# Patient Record
Sex: Female | Born: 1990 | Race: White | Hispanic: No | Marital: Married | Smoking: Former smoker
Health system: Southern US, Community
[De-identification: ages and names within clinical notes are randomized; demographics above are authoritative.]

## PROBLEM LIST (undated history)

## (undated) DIAGNOSIS — K219 Gastro-esophageal reflux disease without esophagitis: Secondary | ICD-10-CM

## (undated) DIAGNOSIS — G43909 Migraine, unspecified, not intractable, without status migrainosus: Secondary | ICD-10-CM

## (undated) DIAGNOSIS — Z8711 Personal history of peptic ulcer disease: Secondary | ICD-10-CM

## (undated) DIAGNOSIS — F32A Depression, unspecified: Secondary | ICD-10-CM

## (undated) HISTORY — DX: Migraine, unspecified, not intractable, without status migrainosus: G43.909

## (undated) HISTORY — DX: Depression, unspecified: F32.A

## (undated) HISTORY — DX: Gastro-esophageal reflux disease without esophagitis: K21.9

## (undated) HISTORY — DX: Personal history of peptic ulcer disease: Z87.11

---
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
## 2010-04-21 ENCOUNTER — Emergency Department (HOSPITAL_COMMUNITY)
Admission: EM | Admit: 2010-04-21 | Discharge: 2010-04-22 | Disposition: A | Discharge status: Home / Self Care | Payer: Medicaid Other | Attending: Emergency Medicine | Admitting: Emergency Medicine

## 2010-04-21 DIAGNOSIS — L02219 Cutaneous abscess of trunk, unspecified: Secondary | ICD-10-CM | POA: Insufficient documentation

## 2010-04-21 DIAGNOSIS — N949 Unspecified condition associated with female genital organs and menstrual cycle: Secondary | ICD-10-CM | POA: Insufficient documentation

## 2010-12-28 ENCOUNTER — Inpatient Hospital Stay (INDEPENDENT_AMBULATORY_CARE_PROVIDER_SITE_OTHER)
Admission: RE | Admit: 2010-12-28 | Discharge: 2010-12-28 | Disposition: A | Discharge status: Home / Self Care | Payer: Medicaid Other | Source: Ambulatory Visit | Attending: Family Medicine | Admitting: Family Medicine

## 2010-12-28 DIAGNOSIS — L0390 Cellulitis, unspecified: Secondary | ICD-10-CM

## 2010-12-28 DIAGNOSIS — L0291 Cutaneous abscess, unspecified: Secondary | ICD-10-CM

## 2010-12-28 LAB — POCT PREGNANCY, URINE: Preg Test, Ur: NEGATIVE

## 2010-12-31 ENCOUNTER — Inpatient Hospital Stay (HOSPITAL_COMMUNITY)
Admission: RE | Admit: 2010-12-31 | Discharge: 2010-12-31 | Disposition: A | Discharge status: Home / Self Care | Payer: Medicaid Other | Source: Ambulatory Visit | Attending: Emergency Medicine | Admitting: Emergency Medicine

## 2011-03-16 ENCOUNTER — Emergency Department (HOSPITAL_COMMUNITY)
Admission: EM | Admit: 2011-03-16 | Discharge: 2011-03-16 | Disposition: A | Discharge status: Home / Self Care | Payer: Medicaid Other | Attending: Emergency Medicine | Admitting: Emergency Medicine

## 2011-03-16 ENCOUNTER — Encounter (HOSPITAL_COMMUNITY): Payer: Self-pay

## 2011-03-16 DIAGNOSIS — R1013 Epigastric pain: Secondary | ICD-10-CM | POA: Insufficient documentation

## 2011-03-16 DIAGNOSIS — H6120 Impacted cerumen, unspecified ear: Secondary | ICD-10-CM

## 2011-03-16 DIAGNOSIS — J3489 Other specified disorders of nose and nasal sinuses: Secondary | ICD-10-CM | POA: Insufficient documentation

## 2011-03-16 DIAGNOSIS — H9209 Otalgia, unspecified ear: Secondary | ICD-10-CM | POA: Insufficient documentation

## 2011-03-16 DIAGNOSIS — K2970 Gastritis, unspecified, without bleeding: Secondary | ICD-10-CM | POA: Insufficient documentation

## 2011-03-16 MED ORDER — OMEPRAZOLE 20 MG PO CPDR: 20.0000 mg | DELAYED_RELEASE_CAPSULE | Freq: Every day | ORAL | Status: DC

## 2011-03-16 NOTE — ED Notes (Signed)
Pt presents with 2 week h/o "pop rocks" in L ear, pt denies any drainage to ear, reports nasal congestion x 1 week.  Pt also reports 1 year h/o epigastric pain.  Pt reports pain is intermittent and reports nausea when pain returns.  Pt taking omeprazole with  No relief.

## 2011-03-16 NOTE — ED Provider Notes (Signed)
History     CSN: [id number]  Arrival date & time 03/16/11  1540   First MD Initiated Contact with Patient 03/16/11 1738      Chief Complaint  Patient presents with  . Otalgia    (Consider location/radiation/quality/duration/timing/severity/associated sxs/prior treatment) Patient is a 21 y.o. female presenting with ear pain. The history is provided by the patient.  Otalgia Associated symptoms include abdominal pain. Pertinent negatives include no ear discharge, no sore throat, no vomiting and no cough.   the patient is a 21 year old, female, with no significant past medical history.  She states says that she has a sensation of a popping sound in her left ear.  She has also had nasal congestion.  She denies a sore throat, otalgia, fevers, chills, nausea or vomiting.  She also states that she has epigastric pain.  The pain does not seem to be associated with foods.  It occurs during the day and as well as night.  She had been taking Prilosec in the past.  The only when necessary and not consistently.  She denies using alcohol.  She denies taking nonsteroidal medications.  History reviewed. No pertinent past medical history.  History reviewed. No pertinent past surgical history.  No family history on file.  History  Substance Use Topics  . Smoking status: Current Everyday Smoker -- 0.5 packs/day  . Smokeless tobacco: Not on file  . Alcohol Use: No    OB History    Grav Para Term Preterm Abortions TAB SAB Ect Mult Living                  Review of Systems  Constitutional: Negative for fever and chills.  HENT: Positive for ear pain and congestion. Negative for sore throat and ear discharge.   Respiratory: Negative for cough and shortness of breath.   Gastrointestinal: Positive for abdominal pain. Negative for nausea and vomiting.  All other systems reviewed and are negative.    Allergies  Review of patient's allergies indicates no known allergies.  Home Medications    Current Outpatient Rx  Name Route Sig Dispense Refill  . LEVONORGESTREL-ETHINYL ESTRAD 0.15-30 MG-MCG PO TABS Oral Take 1 tablet by mouth daily.    Marland Kitchen OMEPRAZOLE 20 MG PO CPDR Oral Take 1 capsule (20 mg total) by mouth daily. 30 capsule 6    BP 130/75  Pulse 80  Temp(Src) 98.6 F (37 C) (Oral)  Resp 18  Ht 5\' 3"  (1.6 m)  Wt 175 lb (79.379 kg)  BMI 31.00 kg/m2  SpO2 98%  LMP 02/25/2011  Physical Exam  Constitutional: She is oriented to person, place, and time. She appears well-developed and well-nourished.  HENT:  Head: Normocephalic and atraumatic.       Left tympanic membrane is obscured by cerumen.  Eyes: Pupils are equal, round, and reactive to light.  Neck: Normal range of motion.  Pulmonary/Chest: Effort normal.  Abdominal: Soft. She exhibits no distension and no mass. There is tenderness. There is no rebound and no guarding.       Mild epigastric tenderness without peritoneal signs  Musculoskeletal: Normal range of motion. She exhibits no edema and no tenderness.  Neurological: She is alert and oriented to person, place, and time. No cranial nerve deficit.  Skin: Skin is warm and dry. No rash noted. No erythema.  Psychiatric: She has a normal mood and affect. Her behavior is normal.    ED Course  Procedures (including critical care time) cerumen impaction, and epigastric tenderness.  Symptoms,  consisting with gastritis.  There are no indications, that she's got pancreatitis, and acute abdomen or cholelithiasis.  No testing is indicated at this time.  Presently, she is asymptomatic and does not want any medications.  Labs Reviewed - No data to display No results found.   1. Gastritis   2. Cerumen impaction       MDM  Gastritis.  No acute abdomen.  No suggestion of cholelithiasis, pancreatitis Cerumen impaction        Nicholes Stairs, MD 03/16/11 [phone]

## 2012-11-01 ENCOUNTER — Encounter (HOSPITAL_COMMUNITY): Payer: Self-pay | Admitting: Adult Health

## 2012-11-01 ENCOUNTER — Emergency Department (HOSPITAL_COMMUNITY)
Admission: EM | Admit: 2012-11-01 | Discharge: 2012-11-01 | Disposition: A | Discharge status: Home / Self Care | Payer: Medicaid Other | Attending: Emergency Medicine | Admitting: Emergency Medicine

## 2012-11-01 DIAGNOSIS — F17200 Nicotine dependence, unspecified, uncomplicated: Secondary | ICD-10-CM | POA: Insufficient documentation

## 2012-11-01 DIAGNOSIS — R21 Rash and other nonspecific skin eruption: Secondary | ICD-10-CM

## 2012-11-01 DIAGNOSIS — Z79899 Other long term (current) drug therapy: Secondary | ICD-10-CM | POA: Insufficient documentation

## 2012-11-01 LAB — COMPREHENSIVE METABOLIC PANEL
ALT: 9 U/L (ref 0–35)
AST: 14 U/L (ref 0–37)
Albumin: 3.2 g/dL — ABNORMAL LOW (ref 3.5–5.2)
Alkaline Phosphatase: 61 U/L (ref 39–117)
BUN: 7 mg/dL (ref 6–23)
CO2: 24 mEq/L (ref 19–32)
Calcium: 8.8 mg/dL (ref 8.4–10.5)
Chloride: 104 mEq/L (ref 96–112)
Creatinine, Ser: 0.59 mg/dL (ref 0.50–1.10)
GFR calc Af Amer: >90 mL/min (ref >90)
GFR calc non Af Amer: >90 mL/min (ref >90)
Glucose, Bld: 90 mg/dL (ref 70–99)
Potassium: 3.5 mEq/L (ref 3.5–5.1)
Sodium: 138 mEq/L (ref 135–145)
Total Bilirubin: 0.2 mg/dL — ABNORMAL LOW (ref 0.3–1.2)
Total Protein: 7.1 g/dL (ref 6.0–8.3)

## 2012-11-01 LAB — CBC WITH DIFFERENTIAL/PLATELET
Basophils Absolute: 0 10*3/uL (ref 0.0–0.1)
Basophils Relative: 0 % (ref 0–1)
Eosinophils Absolute: 0.3 10*3/uL (ref 0.0–0.7)
Eosinophils Relative: 5 % (ref 0–5)
HCT: 36.3 % (ref 36.0–46.0)
Hemoglobin: 12.9 g/dL (ref 12.0–15.0)
Lymphocytes Relative: 42 % (ref 12–46)
Lymphs Abs: 2.8 10*3/uL (ref 0.7–4.0)
MCH: 31.9 pg (ref 26.0–34.0)
MCHC: 35.5 g/dL (ref 30.0–36.0)
MCV: 89.6 fL (ref 78.0–100.0)
Monocytes Absolute: 0.5 10*3/uL (ref 0.1–1.0)
Monocytes Relative: 7 % (ref 3–12)
Neutro Abs: 3 10*3/uL (ref 1.7–7.7)
Neutrophils Relative %: 46 % (ref 43–77)
Platelets: 240 10*3/uL (ref 150–400)
RBC: 4.05 MIL/uL (ref 3.87–5.11)
RDW: 12.2 % (ref 11.5–15.5)
WBC: 6.6 10*3/uL (ref 4.0–10.5)

## 2012-11-01 LAB — APTT: aPTT: 29 seconds (ref 24–37)

## 2012-11-01 LAB — PROTIME-INR
INR: 0.98 (ref 0.00–1.49)
Prothrombin Time: 12.8 seconds (ref 11.6–15.2)

## 2012-11-01 LAB — FIBRINOGEN: Fibrinogen: 495 mg/dL — ABNORMAL HIGH (ref 204–475)

## 2012-11-01 NOTE — ED Provider Notes (Signed)
CSN: [id number]     Arrival date & time 11/01/12  1534 History  This chart was scribed for non-physician practitioner Jaynie Crumble, PA-C working with Flint Melter, MD by Valera Castle, ED scribe. This patient was seen in room TR09C/TR09C and the patient's care was started at 3:51 PM.    Chief Complaint  Patient presents with  . Rash    The history is provided by the patient. No language interpreter was used.   HPI Comments: Rebecca Parks is a 22 y.o. female who presents to the Emergency Department complaining of a rash to her bilateral hands and feet, onset 3 days ago. She reports it first started with blisters on her hands and then became blotchy, with severe pain and also extended to her fingertips and her feet. She denies any presence of the rash in her mouth. She reports pain, especially when she grabs objects, but she reports no itching. She denies any history of a similar rash. She denies going anywhere unusual and using any new products, detergents, or medications. She reports recently using latex gloves, but that the rash began before using them. She denies fever or any other associated symptoms. She reports being sexually active, but only with one partner. She denies any previous medical history. States did have recent contact with a child with hand, foot, mouth disease.  History reviewed. No pertinent past medical history. History reviewed. No pertinent past surgical history. History reviewed. No pertinent family history. History  Substance Use Topics  . Smoking status: Current Every Day Smoker -- 0.50 packs/day  . Smokeless tobacco: Not on file  . Alcohol Use: No   OB History   Grav Para Term Preterm Abortions TAB SAB Ect Mult Living                 Review of Systems  Constitutional: Negative for fever.  Skin: Positive for rash.  All other systems reviewed and are negative.    Allergies  Review of patient's allergies indicates no known allergies.  Home  Medications   Current Outpatient Rx  Name  Route  Sig  Dispense  Refill  . levonorgestrel-ethinyl estradiol (NORDETTE) 0.15-30 MG-MCG tablet   Oral   Take 1 tablet by mouth daily.         Marland Kitchen EXPIRED: omeprazole (PRILOSEC) 20 MG capsule   Oral   Take 1 capsule (20 mg total) by mouth daily.   30 capsule   6    Triage Vitals: BP 135/71  Pulse 87  Temp(Src) 98.4 F (36.9 C) (Oral)  Resp 16  SpO2 98%  Physical Exam  Nursing note and vitals reviewed. Constitutional: She is oriented to person, place, and time. She appears well-developed and well-nourished.  HENT:  Head: Normocephalic and atraumatic.  Eyes: EOM are normal.  Neck: Normal range of motion. Neck supple.  Cardiovascular: Normal rate.   Pulmonary/Chest: Effort normal.  Musculoskeletal: Normal range of motion.  Neurological: She is alert and oriented to person, place, and time.  Skin: Skin is warm and dry.  Fine erythematous, raised, papular rash mainly to the palms and dorsum of the hands, fingers, wrists, forearms, soles of the feet, and dorsal feet. Rash is sensitive to the touch. Blanching on hands. Few spots that are non blanching to the toes  Psychiatric: She has a normal mood and affect. Her behavior is normal.    ED Course  Procedures (including critical care time)  DIAGNOSTIC STUDIES: Oxygen Saturation is 98% on room air, normal by  my interpretation.    COORDINATION OF CARE: 3:56 PM-Discussed treatment plan which includes a syphilis test, blood work, and Catering manager Dr. Effie Shy with pt at bedside and pt agreed to plan.    Date: 11/01/2012  Rate: 72  Rhythm: normal sinus rhythm  QRS Axis: normal  Intervals: normal  ST/T Wave abnormalities: normal  Conduction Disutrbances: none  Narrative Interpretation:   Old EKG Reviewed: No significant changes noted    Labs Review Labs Reviewed  COMPREHENSIVE METABOLIC PANEL - Abnormal; Notable for the following:    Albumin 3.2 (*)    Total Bilirubin 0.2 (*)     All other components within normal limits  FIBRINOGEN - Abnormal; Notable for the following:    Fibrinogen 495 (*)    All other components within normal limits  CBC WITH DIFFERENTIAL  PROTIME-INR  APTT  RPR   Imaging Review No results found.  MDM  No diagnosis found.   Patient with nonspecific rash to the hands and feet. Rash is not itchy. It is painful. He does not look herpetic, does not have any blisters, no pustules. It does not appear to be scabies since is not itchy. RPR and coagulation panel obtained. Hemorrhage and just slightly elevated everything else appears to be within normal range. RPR is pending. Test results with patient. Patient will be discharged home with Tylenol and Motrin for pain. She will followup with a primary care Dr. or rheumatology if it's not improving. Patient structured to try not to use any latex or Betadine to see if that'll help. She is instructed to return if she develops any fever, chills, malaise, worsening rash, any new concerning symptoms.   Filed Vitals:   11/01/12 1546  BP: 135/71  Pulse: 87  Temp: 98.4 F (36.9 C)  TempSrc: Oral  Resp: 16  SpO2: 98%   I personally performed the services described in this documentation, which was scribed in my presence. The recorded information has been reviewed and is accurate.    Lottie Mussel, PA-C 11/01/12 1749

## 2012-11-01 NOTE — ED Provider Notes (Signed)
  Face-to-face evaluation   History: Rash on her hands and feet for 3 days, after starting to use Betadine  Physical exam: Alert, calm, cooperative. No respiratory distress. Skin exam- fingers, and toes bilaterally, with tiny, red, flat, semi-blanching, scattered rash; without associated epidermal change. No associated joint swelling, deformity, drainage or fluctuance.  Assessment- atypical dermatitis without clear cause. It is most likely consistent with an allergy, with atypical response. An unusual embolic phenomenon, or vasculitis could potentially have this picture as well.  Medical screening examination/treatment/procedure(s) were conducted as a shared visit with non-physician practitioner(s) and myself.  I personally evaluated the patient during the encounter  Flint Melter, MD 11/03/12 1408

## 2012-11-01 NOTE — ED Notes (Addendum)
Presents with rash to wrists that began 2-3 days ago and is beginning to go up forearm. Denies fevers.  Reports itching and pain to soles of feet and inbetween toes.

## 2012-11-02 LAB — RPR: RPR Ser Ql: NONREACTIVE

## 2012-11-02 NOTE — ED Provider Notes (Signed)
Medical screening examination/treatment/procedure(s) were performed by non-physician practitioner and as supervising physician I was immediately available for consultation/collaboration.  Flint Melter, MD 11/02/12 [phone]

## 2013-04-24 ENCOUNTER — Emergency Department (HOSPITAL_COMMUNITY)
Admission: EM | Admit: 2013-04-24 | Discharge: 2013-04-25 | Disposition: A | Discharge status: Home / Self Care | Payer: Medicaid Other | Attending: Emergency Medicine | Admitting: Emergency Medicine

## 2013-04-24 ENCOUNTER — Encounter (HOSPITAL_COMMUNITY): Payer: Self-pay | Admitting: Emergency Medicine

## 2013-04-24 DIAGNOSIS — Z792 Long term (current) use of antibiotics: Secondary | ICD-10-CM | POA: Insufficient documentation

## 2013-04-24 DIAGNOSIS — L0390 Cellulitis, unspecified: Secondary | ICD-10-CM

## 2013-04-24 DIAGNOSIS — L0291 Cutaneous abscess, unspecified: Secondary | ICD-10-CM

## 2013-04-24 DIAGNOSIS — Z79899 Other long term (current) drug therapy: Secondary | ICD-10-CM | POA: Insufficient documentation

## 2013-04-24 DIAGNOSIS — F17200 Nicotine dependence, unspecified, uncomplicated: Secondary | ICD-10-CM | POA: Insufficient documentation

## 2013-04-24 DIAGNOSIS — H60399 Other infective otitis externa, unspecified ear: Secondary | ICD-10-CM | POA: Insufficient documentation

## 2013-04-24 NOTE — ED Provider Notes (Signed)
CSN: [id number]     Arrival date & time 04/24/13  2040 History  This chart was scribed for non-physician practitioner, Lowella Dell, PA-C working with Gerhard Munch, MD by Luisa Dago, ED scribe. This patient was seen in room TR04C/TR04C and the patient's care was started at 9:14 PM.     Chief Complaint  Patient presents with  . Abscess    The history is provided by the patient. No language interpreter was used.   HPI Comments: Rebecca Parks is a 23 y.o. female who presents to the Emergency Department complaining of an abscess that started 2 days ago. Pt states that the abscess is located behind her right ear. She is complaining of associated ear pain secondary to the location of the abscess. She denies any headache, nausea/vomiting, hearing loss, visual changes, rash, fever, chills, or abdominal pain.   History reviewed. No pertinent past medical history. History reviewed. No pertinent past surgical history. History reviewed. No pertinent family history. History  Substance Use Topics  . Smoking status: Current Every Day Smoker -- 0.50 packs/day  . Smokeless tobacco: Never Used  . Alcohol Use: No   OB History   Grav Para Term Preterm Abortions TAB SAB Ect Mult Living                 Review of Systems  All other systems reviewed and are negative.      Allergies  Review of patient's allergies indicates no known allergies.  Home Medications   Current Outpatient Rx  Name  Route  Sig  Dispense  Refill  . Ascorbic Acid (EQL VITAMIN C) 500 MG CHEW   Oral   Chew 1,000 mg by mouth daily.         Marland Kitchen aspirin-acetaminophen-caffeine (EXCEDRIN MIGRAINE) 250-250-65 MG per tablet   Oral   Take 2 tablets by mouth every 6 (six) hours as needed for migraine.         Marland Kitchen GINSENG PO   Oral   Take 1 tablet by mouth daily.         Marland Kitchen ibuprofen (ADVIL,MOTRIN) 200 MG tablet   Oral   Take 600 mg by mouth every 6 (six) hours as needed for headache.          . minocycline  (MINOCIN,DYNACIN) 100 MG capsule   Oral   Take 100 mg by mouth 2 (two) times daily.         . Multiple Vitamin (MULTIVITAMIN WITH MINERALS) TABS tablet   Oral   Take 1 tablet by mouth daily.         . norgestimate-ethinyl estradiol (SPRINTEC 28) 0.25-35 MG-MCG tablet   Oral   Take 1 tablet by mouth daily.         Marland Kitchen omeprazole (PRILOSEC) 20 MG capsule   Oral   Take 20 mg by mouth daily.         Marland Kitchen ibuprofen (ADVIL,MOTRIN) 600 MG tablet   Oral   Take 1 tablet (600 mg total) by mouth every 6 (six) hours as needed.   30 tablet   0   . sulfamethoxazole-trimethoprim (SEPTRA DS) 800-160 MG per tablet   Oral   Take 1 tablet by mouth every 12 (twelve) hours.   20 tablet   0    BP 130/75  Pulse 96  Temp(Src) 98 F (36.7 C)  Resp 18  Ht 5\' 2"  (1.575 m)  Wt 186 lb 11.2 oz (84.687 kg)  BMI 34.14 kg/m2  SpO2 97%  LMP  04/03/2013  Physical Exam  Nursing note and vitals reviewed. Constitutional: She is oriented to person, place, and time. She appears well-developed and well-nourished. No distress.  HENT:  Head: Normocephalic and atraumatic. Head is without laceration, without right periorbital erythema and without left periorbital erythema.  Right Ear: Tympanic membrane and ear canal normal. No lacerations. There is swelling and tenderness. No drainage. No mastoid tenderness. No middle ear effusion. No hemotympanum. No decreased hearing is noted.  Left Ear: Tympanic membrane and ear canal normal. No lacerations. No drainage, swelling or tenderness. No mastoid tenderness.  No middle ear effusion. No hemotympanum. No decreased hearing is noted.  Ears:  Nose: Nose normal.  Mouth/Throat: Uvula is midline, oropharynx is clear and moist and mucous membranes are normal. No oral lesions. No trismus in the jaw. No uvula swelling. No oropharyngeal exudate, posterior oropharyngeal edema, posterior oropharyngeal erythema or tonsillar abscesses.  Abscess behind right ear. Fluctuant mass  and erythema behind right ear. No drainage. Erythema and edema of right ear lobe as depicted in diagram above. Findings consistent with abscess and cellulitis.     Eyes: Conjunctivae and EOM are normal. Pupils are equal, round, and reactive to light.  Neck: Normal range of motion, full passive range of motion without pain and phonation normal. Neck supple. No tracheal tenderness present. No rigidity. No tracheal deviation, no edema, no erythema and normal range of motion present.  Cardiovascular: Normal rate, regular rhythm and normal heart sounds.   No murmur heard. Pulmonary/Chest: Effort normal and breath sounds normal. No stridor. No respiratory distress. She has no wheezes. She has no rales.  Musculoskeletal: Normal range of motion.  Lymphadenopathy:       Head (right side): No submental, no submandibular, no tonsillar, no preauricular and no posterior auricular adenopathy present.       Head (left side): No submental, no submandibular, no tonsillar, no preauricular and no posterior auricular adenopathy present.    She has no cervical adenopathy.  Neurological: She is alert and oriented to person, place, and time.  Skin: Skin is warm and dry.  Psychiatric: She has a normal mood and affect. Her behavior is normal.    ED Course  Procedures (including critical care time)  DIAGNOSTIC STUDIES: Oxygen Saturation is 97% on RA, adequate by my interpretation.    COORDINATION OF CARE: 9:25 PM- Pt advised of plan for treatment and pt agrees 12:04 AM- Abscess was successfully drained and irrigated. Pt tolerated procedure well. Will prescribe antibiotics.   Labs Review Labs Reviewed - No data to display Imaging Review No results found.  EKG Interpretation   None     INCISION AND DRAINAGE Performed by: Rudene Anda Consent: Verbal consent obtained. Risks and benefits: risks, benefits and alternatives were discussed Time out performed prior to procedure Type: abscess Body  area: Right posterior ear Anesthesia: local infiltration Incision was made with a scalpel. Local anesthetic: lidocaine 2% w/o epinephrine Anesthetic total: 1 ml Complexity: complex Blunt dissection to break up loculations Drainage: purulent Drainage amount: 3 mL Packing material: N/A Patient tolerance: Patient tolerated the procedure well with no immediate complications.   MDM   Final diagnoses:  Abscess and cellulitis   Return to Emergency Department in 2 days for wound recheck. Take antibiotics as directed. Return to Emergency Department sooner if you develop any hearing loss, difficulty opening jaw, worsening pain, fever/chills, or dizziness.  Meds given in ED:  Medications - No data to display  Discharge Medication List as of 04/25/2013 12:17  AM    START taking these medications   Details  !! ibuprofen (ADVIL,MOTRIN) 600 MG tablet Take 1 tablet (600 mg total) by mouth every 6 (six) hours as needed., Starting 04/25/2013, Until Discontinued, Print    sulfamethoxazole-trimethoprim (SEPTRA DS) 800-160 MG per tablet Take 1 tablet by mouth every 12 (twelve) hours., Starting 04/25/2013, Until Discontinued, Print     !! - Potential duplicate medications found. Please discuss with provider.       I personally performed the services described in this documentation, which was scribed in my presence. The recorded information has been reviewed and is accurate.    Rudene AndaJacob Gray Caylen Kuwahara, PA-C 04/25/13 1610

## 2013-04-24 NOTE — ED Notes (Signed)
Governor SpeckingP. Lackey PA at bedside performing I/D on pt.'s abscess.

## 2013-04-24 NOTE — ED Notes (Signed)
Pt states swelling behind right ear x 2 days.

## 2013-04-25 MED ORDER — SULFAMETHOXAZOLE-TRIMETHOPRIM 800-160 MG PO TABS: 1.0000 | ORAL_TABLET | Freq: Two times a day (BID) | ORAL | Status: DC

## 2013-04-25 MED ORDER — IBUPROFEN 600 MG PO TABS: 600.0000 mg | ORAL_TABLET | Freq: Four times a day (QID) | ORAL | Status: DC | PRN

## 2013-04-25 NOTE — Discharge Instructions (Signed)
Return to Emergency Department in 2 days for wound recheck. Take antibiotics as directed. Return to Emergency Department sooner if you develop any hearing loss, difficulty opening jaw, worsening pain, fever/chills, or dizziness.

## 2013-04-25 NOTE — ED Provider Notes (Signed)
Medical screening examination/treatment/procedure(s) were performed by non-physician practitioner and as supervising physician I was immediately available for consultation/collaboration.  Rebecca Munchobert Damonique Brunelle, MD 04/25/13 [phone]

## 2014-08-07 ENCOUNTER — Encounter (HOSPITAL_COMMUNITY): Payer: Self-pay | Admitting: *Deleted

## 2014-08-07 ENCOUNTER — Emergency Department (HOSPITAL_COMMUNITY): Payer: Medicaid Other

## 2014-08-07 ENCOUNTER — Emergency Department (HOSPITAL_COMMUNITY)
Admission: EM | Admit: 2014-08-07 | Discharge: 2014-08-07 | Disposition: A | Discharge status: Home / Self Care | Payer: Medicaid Other | Attending: Emergency Medicine | Admitting: Emergency Medicine

## 2014-08-07 DIAGNOSIS — Z720 Tobacco use: Secondary | ICD-10-CM | POA: Insufficient documentation

## 2014-08-07 DIAGNOSIS — Z79899 Other long term (current) drug therapy: Secondary | ICD-10-CM | POA: Insufficient documentation

## 2014-08-07 DIAGNOSIS — R059 Cough, unspecified: Secondary | ICD-10-CM

## 2014-08-07 DIAGNOSIS — J189 Pneumonia, unspecified organism: Secondary | ICD-10-CM

## 2014-08-07 DIAGNOSIS — Z793 Long term (current) use of hormonal contraceptives: Secondary | ICD-10-CM | POA: Insufficient documentation

## 2014-08-07 DIAGNOSIS — R05 Cough: Secondary | ICD-10-CM

## 2014-08-07 DIAGNOSIS — R51 Headache: Secondary | ICD-10-CM | POA: Insufficient documentation

## 2014-08-07 DIAGNOSIS — J159 Unspecified bacterial pneumonia: Secondary | ICD-10-CM | POA: Insufficient documentation

## 2014-08-07 MED ORDER — AZITHROMYCIN 250 MG PO TABS: 250.0000 mg | ORAL_TABLET | Freq: Every day | ORAL | Status: AC

## 2014-08-07 MED ORDER — CEFTRIAXONE SODIUM 1 G IJ SOLR
1.0000 g | Freq: Once | INTRAMUSCULAR | Status: AC
  Administered 2014-08-07: 1 g via INTRAMUSCULAR
  Filled 2014-08-07: qty 10

## 2014-08-07 MED ORDER — ACETAMINOPHEN 325 MG PO TABS
650.0000 mg | ORAL_TABLET | Freq: Once | ORAL | Status: AC
  Administered 2014-08-07: 650 mg via ORAL
  Filled 2014-08-07: qty 2

## 2014-08-07 MED ORDER — AZITHROMYCIN 250 MG PO TABS
500.0000 mg | ORAL_TABLET | Freq: Once | ORAL | Status: AC
  Administered 2014-08-07: 500 mg via ORAL
  Filled 2014-08-07: qty 2

## 2014-08-07 MED ORDER — DEXTROMETHORPHAN POLISTIREX ER 30 MG/5ML PO SUER: 15.0000 mg | Freq: Two times a day (BID) | ORAL | Status: DC

## 2014-08-07 MED ORDER — LIDOCAINE HCL 2 % IJ SOLN
INTRAMUSCULAR | Status: AC
  Administered 2014-08-07: 2.1 mL
  Filled 2014-08-07: qty 20

## 2014-08-07 NOTE — ED Notes (Signed)
Pt reports productive cough with yellowish green sputum, fever and congestion since Monday.

## 2014-08-07 NOTE — ED Provider Notes (Signed)
CSN: [id number]     Arrival date & time 08/07/14  2005 History   None   This chart was scribed for NP, Earley Favor, working with Eber Hong, MD by Marica Otter, ED Scribe. This patient was seen in room WTR7/WTR7 and the patient's care was started at 8:26 PM.  Chief Complaint  Patient presents with  . Nasal Congestion  . Fever  . Cough   The history is provided by the patient. No language interpreter was used.   PCP: Default, Provider, MD HPI Comments: Rebecca Parks is a 24 y.o. female, with PMH noted below, who presents to the Emergency Department complaining of nasal congestion with associated intermittent productive cough with green sputum, and headache onset 3 days ago. Pt reports her last menstrual cycle ended one week ago. Pt reports mucinex at home with no relief. Pt denies a Hx of asthma.   History reviewed. No pertinent past medical history. History reviewed. No pertinent past surgical history. No family history on file. History  Substance Use Topics  . Smoking status: Current Every Day Smoker -- 0.50 packs/day    Types: Cigarettes  . Smokeless tobacco: Never Used  . Alcohol Use: No   OB History    No data available     Review of Systems  HENT: Positive for congestion.   Respiratory: Positive for cough. Negative for shortness of breath.   Neurological: Positive for headaches.  All other systems reviewed and are negative.     Allergies  Review of patient's allergies indicates no known allergies.  Home Medications   Prior to Admission medications   Medication Sig Start Date End Date Taking? Authorizing Provider  Ascorbic Acid (EQL VITAMIN C) 500 MG CHEW Chew 1,000 mg by mouth daily.    Historical Provider, MD  aspirin-acetaminophen-caffeine (EXCEDRIN MIGRAINE) [id number] MG per tablet Take 2 tablets by mouth every 6 (six) hours as needed for migraine.    Historical Provider, MD  GINSENG PO Take 1 tablet by mouth daily.    Historical Provider, MD  ibuprofen  (ADVIL,MOTRIN) 200 MG tablet Take 600 mg by mouth every 6 (six) hours as needed for headache.     Historical Provider, MD  ibuprofen (ADVIL,MOTRIN) 600 MG tablet Take 1 tablet (600 mg total) by mouth every 6 (six) hours as needed. 04/25/13   Cristobal Goldmann, PA-C  minocycline (MINOCIN,DYNACIN) 100 MG capsule Take 100 mg by mouth 2 (two) times daily.    Historical Provider, MD  Multiple Vitamin (MULTIVITAMIN WITH MINERALS) TABS tablet Take 1 tablet by mouth daily.    Historical Provider, MD  norgestimate-ethinyl estradiol (SPRINTEC 28) 0.25-35 MG-MCG tablet Take 1 tablet by mouth daily.    Historical Provider, MD  omeprazole (PRILOSEC) 20 MG capsule Take 20 mg by mouth daily.    Historical Provider, MD  sulfamethoxazole-trimethoprim (SEPTRA DS) 800-160 MG per tablet Take 1 tablet by mouth every 12 (twelve) hours. 04/25/13   Cristobal Goldmann, PA-C   Triage Vitals: BP 122/62 mmHg  Pulse 90  Temp(Src) 101.1 F (38.4 C) (Oral)  Resp 18  SpO2 95%  LMP 07/27/2014 Physical Exam  Constitutional: She is oriented to person, place, and time. She appears well-developed and well-nourished. No distress.  HENT:  Head: Normocephalic and atraumatic.  Right Ear: External ear normal.  Left Ear: External ear normal.  Eyes: Conjunctivae and EOM are normal. Pupils are equal, round, and reactive to light.  Neck: Normal range of motion. Neck supple.  Cardiovascular: Normal rate.   Pulmonary/Chest: Effort normal.  No respiratory distress. She has wheezes. She exhibits no tenderness.  Wheezing R middle lobe area  Abdominal: Soft.  Musculoskeletal: Normal range of motion.  Lymphadenopathy:    She has no cervical adenopathy.  Neurological: She is alert and oriented to person, place, and time.  Skin: Skin is warm and dry.  Psychiatric: She has a normal mood and affect. Her behavior is normal.  Nursing note and vitals reviewed.   ED Course  Procedures (including critical care time) DIAGNOSTIC STUDIES: Oxygen  Saturation is 95% on RA, adequate by my interpretation.    COORDINATION OF CARE: 8:28 PM-Discussed treatment plan which includes chest xray with pt at bedside and pt agreed to plan.  Labs Review Labs Reviewed - No data to display  Imaging Review No results found.   EKG Interpretation None     Will give IM Rocephin  And PO Azithromycin and Rx for completion of antibiotic as well as cough suppressant  MDM   Final diagnoses:  None    I personally performed the services described in this documentation, which was scribed in my presence. The recorded information has been reviewed and is accurate.  Earley Favor, NP 08/07/14 2129  Eber Hong, MD 08/09/14 0001

## 2015-01-08 ENCOUNTER — Encounter (HOSPITAL_COMMUNITY): Payer: Self-pay | Admitting: *Deleted

## 2015-01-08 ENCOUNTER — Emergency Department (HOSPITAL_COMMUNITY)
Admission: EM | Admit: 2015-01-08 | Discharge: 2015-01-08 | Disposition: A | Discharge status: Home / Self Care | Payer: BLUE CROSS/BLUE SHIELD | Attending: Emergency Medicine | Admitting: Emergency Medicine

## 2015-01-08 DIAGNOSIS — Z79899 Other long term (current) drug therapy: Secondary | ICD-10-CM | POA: Insufficient documentation

## 2015-01-08 DIAGNOSIS — Z720 Tobacco use: Secondary | ICD-10-CM | POA: Diagnosis not present

## 2015-01-08 DIAGNOSIS — Z79818 Long term (current) use of other agents affecting estrogen receptors and estrogen levels: Secondary | ICD-10-CM | POA: Diagnosis not present

## 2015-01-08 DIAGNOSIS — Z792 Long term (current) use of antibiotics: Secondary | ICD-10-CM | POA: Insufficient documentation

## 2015-01-08 DIAGNOSIS — M5441 Lumbago with sciatica, right side: Secondary | ICD-10-CM | POA: Diagnosis not present

## 2015-01-08 DIAGNOSIS — M545 Low back pain: Secondary | ICD-10-CM | POA: Diagnosis present

## 2015-01-08 MED ORDER — METHOCARBAMOL 750 MG PO TABS: 750.0000 mg | ORAL_TABLET | Freq: Two times a day (BID) | ORAL | Status: DC

## 2015-01-08 MED ORDER — PREDNISONE 20 MG PO TABS
60.0000 mg | ORAL_TABLET | Freq: Once | ORAL | Status: AC
  Administered 2015-01-08: 60 mg via ORAL
  Filled 2015-01-08: qty 3

## 2015-01-08 MED ORDER — METHOCARBAMOL 500 MG PO TABS: 750.0000 mg | ORAL_TABLET | Freq: Once | ORAL | Status: DC

## 2015-01-08 MED ORDER — KETOROLAC TROMETHAMINE 60 MG/2ML IM SOLN
60.0000 mg | Freq: Once | INTRAMUSCULAR | Status: AC
  Administered 2015-01-08: 60 mg via INTRAMUSCULAR
  Filled 2015-01-08: qty 2

## 2015-01-08 MED ORDER — PREDNISONE 20 MG PO TABS: 60.0000 mg | ORAL_TABLET | Freq: Every day | ORAL | Status: DC

## 2015-01-08 NOTE — Discharge Instructions (Signed)
As we discussed, please follow-up with ortho for ongoing management of your symptoms. In the meantime you may continue the steroid and muscle relaxer as prescribed. You may also take 800mg  ibuprofen as needed for pain and swelling. Return to the ER for new or concerning symptoms.  Please obtain all of your results from medical records or have your doctors office obtain the results - share them with your doctor - you should be seen at your doctors office in the next 2 days. Call today to arrange your follow up. Take the medications as prescribed. Please review all of the medicines and only take them if you do not have an allergy to them. Please be aware that if you are taking birth control pills, taking other prescriptions, ESPECIALLY ANTIBIOTICS may make the birth control ineffective - if this is the case, either do not engage in sexual activity or use alternative methods of birth control such as condoms until you have finished the medicine and your family doctor says it is OK to restart them. If you are on a blood thinner such as COUMADIN, be aware that any other medicine that you take may cause the coumadin to either work too much, or not enough - you should have your coumadin level rechecked in next 7 days if this is the case.  ?  It is also a possibility that you have an allergic reaction to any of the medicines that you have been prescribed - Everybody reacts differently to medications and while MOST people have no trouble with most medicines, you may have a reaction such as nausea, vomiting, rash, swelling, shortness of breath. If this is the case, please stop taking the medicine immediately and contact your physician.  ?  You should return to the ER if you develop severe or worsening symptoms.

## 2015-01-08 NOTE — ED Notes (Signed)
Pt stated "I've had back pain for as long as I can remember but for the past 4 months I've had the sharp, shooting pain down into my left leg".  Pt denied having a PCP.

## 2015-01-08 NOTE — ED Provider Notes (Signed)
CSN: [id number]     Arrival date & time 01/08/15  1504 History   First MD Initiated Contact with Patient 01/08/15 1507     No chief complaint on file.   HPI   Ms. Rebecca Parks is an 24 y.o. female with no significant PMH who presents to the ED for evaluation of low back pain. She states the pain started about four months ago and describes it as dull/aching. She states that for the past couple of months she has also noticed associated sharp/shooting pain in her right leg when the pain flares. She states that the pain is not constant and will come in waves but the episodes are not associated with a particular activity or movement. She has been taking tylenol for the back pain with minimal relief. Denies radiation of the pain. Denies weakness. Denies saddle paresthesias, bowel/bladder incontinence, fever, chills, N/V/D. She states she has had problems with back pain before and was told she has spondylolisthesis. L-spine XR from 2009 shows bilateral L5 pars defects with grade I spondylolisthesis L5 forward on S1 measuring 9 mm (increased from 6 mm in 2006). Denies EtOH, tobacco, or other drug use.   No past medical history on file. No past surgical history on file. No family history on file. Social History  Substance Use Topics  . Smoking status: Current Every Day Smoker -- 0.50 packs/day    Types: Cigarettes  . Smokeless tobacco: Never Used  . Alcohol Use: No   OB History    No data available     Review of Systems  All other systems reviewed and are negative.     Allergies  Review of patient's allergies indicates no known allergies.  Home Medications   Prior to Admission medications   Medication Sig Start Date End Date Taking? Authorizing Provider  Ascorbic Acid (EQL VITAMIN C) 500 MG CHEW Chew 1,000 mg by mouth daily.    Historical Provider, MD  aspirin-acetaminophen-caffeine (EXCEDRIN MIGRAINE) [id number] MG per tablet Take 2 tablets by mouth every 6 (six) hours as needed for  migraine.    Historical Provider, MD  dextromethorphan (DELSYM) 30 MG/5ML liquid Take 2.5 mLs (15 mg total) by mouth 2 (two) times daily. 08/07/14   Earley FavorGail Schulz, NP  GINSENG PO Take 1 tablet by mouth daily.    Historical Provider, MD  ibuprofen (ADVIL,MOTRIN) 200 MG tablet Take 600 mg by mouth every 6 (six) hours as needed for headache.     Historical Provider, MD  ibuprofen (ADVIL,MOTRIN) 600 MG tablet Take 1 tablet (600 mg total) by mouth every 6 (six) hours as needed. 04/25/13   Cristobal GoldmannJacob Lackey, PA-C  minocycline (MINOCIN,DYNACIN) 100 MG capsule Take 100 mg by mouth 2 (two) times daily.    Historical Provider, MD  Multiple Vitamin (MULTIVITAMIN WITH MINERALS) TABS tablet Take 1 tablet by mouth daily.    Historical Provider, MD  norgestimate-ethinyl estradiol (SPRINTEC 28) 0.25-35 MG-MCG tablet Take 1 tablet by mouth daily.    Historical Provider, MD  omeprazole (PRILOSEC) 20 MG capsule Take 20 mg by mouth daily.    Historical Provider, MD  sulfamethoxazole-trimethoprim (SEPTRA DS) 800-160 MG per tablet Take 1 tablet by mouth every 12 (twelve) hours. 04/25/13   Cristobal GoldmannJacob Lackey, PA-C   BP 123/63 mmHg  Pulse 76  Temp(Src) 98.1 F (36.7 C) (Oral)  Resp 16  SpO2 95% Physical Exam  Constitutional: She is oriented to person, place, and time. No distress.  HENT:  Head: Atraumatic.  Right Ear: External ear normal.  Left  Ear: External ear normal.  Mouth/Throat: Oropharynx is clear and moist. No oropharyngeal exudate.  Eyes: Conjunctivae and EOM are normal. Pupils are equal, round, and reactive to light.  Neck: Normal range of motion. Neck supple.  Cardiovascular: Normal rate, regular rhythm, normal heart sounds and intact distal pulses.   No murmur heard. Pulmonary/Chest: Effort normal and breath sounds normal. No respiratory distress. She has no wheezes.  Abdominal: Soft. Bowel sounds are normal. She exhibits no distension. There is no tenderness.  Musculoskeletal: Normal range of motion. She exhibits  no edema or tenderness.  Strength and sensation intact in upper and lower extremities  Lymphadenopathy:    She has no cervical adenopathy.  Neurological: She is alert and oriented to person, place, and time. No cranial nerve deficit. Coordination and gait normal.  Skin: Skin is warm and dry.  Psychiatric: She has a normal mood and affect.  Nursing note and vitals reviewed.   ED Course  Procedures (including critical care time) Labs Review Labs Reviewed - No data to display  Imaging Review No results found. I have personally reviewed and evaluated these images and lab results as part of my medical decision-making.   EKG Interpretation None      MDM   Final diagnoses:  Low back pain with right-sided sciatica, unspecified back pain laterality    No red flag symptoms for cauda equina / epidural abscess. Pt's entire spine nontender to palpation and FROM. I do not think emergent imaging is warranted at this time given chronic nature of pt's presentation and no new injury or trauma. Will give prednisone and ketorolac here. Will give rx for robaxin, short course of prednisone, and encouraged pt to use ibuprofen as needed for pain. Will refer to ortho for f/u. Return precautions given.    Carlene Coria, PA-C 01/08/15 1628  Richardean Canal, MD 01/09/15 [phone]

## 2015-03-04 ENCOUNTER — Emergency Department (HOSPITAL_COMMUNITY)
Admission: EM | Admit: 2015-03-04 | Discharge: 2015-03-04 | Disposition: A | Discharge status: Home / Self Care | Payer: BLUE CROSS/BLUE SHIELD | Attending: Emergency Medicine | Admitting: Emergency Medicine

## 2015-03-04 ENCOUNTER — Encounter (HOSPITAL_COMMUNITY): Payer: Self-pay | Admitting: Emergency Medicine

## 2015-03-04 DIAGNOSIS — Z793 Long term (current) use of hormonal contraceptives: Secondary | ICD-10-CM | POA: Insufficient documentation

## 2015-03-04 DIAGNOSIS — Z79899 Other long term (current) drug therapy: Secondary | ICD-10-CM | POA: Diagnosis not present

## 2015-03-04 DIAGNOSIS — R05 Cough: Secondary | ICD-10-CM | POA: Diagnosis present

## 2015-03-04 DIAGNOSIS — Z7952 Long term (current) use of systemic steroids: Secondary | ICD-10-CM | POA: Insufficient documentation

## 2015-03-04 DIAGNOSIS — Z87891 Personal history of nicotine dependence: Secondary | ICD-10-CM | POA: Insufficient documentation

## 2015-03-04 DIAGNOSIS — Z792 Long term (current) use of antibiotics: Secondary | ICD-10-CM | POA: Insufficient documentation

## 2015-03-04 DIAGNOSIS — J069 Acute upper respiratory infection, unspecified: Secondary | ICD-10-CM

## 2015-03-04 MED ORDER — PSEUDOEPHEDRINE-GUAIFENESIN ER 60-600 MG PO TB12: 1.0000 | ORAL_TABLET | Freq: Two times a day (BID) | ORAL | Status: DC

## 2015-03-04 NOTE — Discharge Instructions (Signed)
There does not appear to be an emergent cause her symptoms at this time. Your symptoms are likely due to a virus and will resolve on their own. Please take your medications as prescribed. Follow-up with your doctor as needed. Return to ED for any new or worsening symptoms.  Upper Respiratory Infection, Adult Most upper respiratory infections (URIs) are a viral infection of the air passages leading to the lungs. A URI affects the nose, throat, and upper air passages. The most common type of URI is nasopharyngitis and is typically referred to as "the common cold." URIs run their course and usually go away on their own. Most of the time, a URI does not require medical attention, but sometimes a bacterial infection in the upper airways can follow a viral infection. This is called a secondary infection. Sinus and middle ear infections are common types of secondary upper respiratory infections. Bacterial pneumonia can also complicate a URI. A URI can worsen asthma and chronic obstructive pulmonary disease (COPD). Sometimes, these complications can require emergency medical care and may be life threatening.  CAUSES Almost all URIs are caused by viruses. A virus is a type of germ and can spread from one person to another.  RISKS FACTORS You may be at risk for a URI if:   You smoke.   You have chronic heart or lung disease.  You have a weakened defense (immune) system.   You are very young or very old.   You have nasal allergies or asthma.  You work in crowded or poorly ventilated areas.  You work in health care facilities or schools. SIGNS AND SYMPTOMS  Symptoms typically develop 2-3 days after you come in contact with a cold virus. Most viral URIs last 7-10 days. However, viral URIs from the influenza virus (flu virus) can last 14-18 days and are typically more severe. Symptoms may include:   Runny or stuffy (congested) nose.   Sneezing.   Cough.   Sore throat.   Headache.    Fatigue.   Fever.   Loss of appetite.   Pain in your forehead, behind your eyes, and over your cheekbones (sinus pain).  Muscle aches.  DIAGNOSIS  Your health care provider may diagnose a URI by:  Physical exam.  Tests to check that your symptoms are not due to another condition such as:  Strep throat.  Sinusitis.  Pneumonia.  Asthma. TREATMENT  A URI goes away on its own with time. It cannot be cured with medicines, but medicines may be prescribed or recommended to relieve symptoms. Medicines may help:  Reduce your fever.  Reduce your cough.  Relieve nasal congestion. HOME CARE INSTRUCTIONS   Take medicines only as directed by your health care provider.   Gargle warm saltwater or take cough drops to comfort your throat as directed by your health care provider.  Use a warm mist humidifier or inhale steam from a shower to increase air moisture. This may make it easier to breathe.  Drink enough fluid to keep your urine clear or pale yellow.   Eat soups and other clear broths and maintain good nutrition.   Rest as needed.   Return to work when your temperature has returned to normal or as your health care provider advises. You may need to stay home longer to avoid infecting others. You can also use a face mask and careful hand washing to prevent spread of the virus.  Increase the usage of your inhaler if you have asthma.   Do  not use any tobacco products, including cigarettes, chewing tobacco, or electronic cigarettes. If you need help quitting, ask your health care provider. PREVENTION  The best way to protect yourself from getting a cold is to practice good hygiene.   Avoid oral or hand contact with people with cold symptoms.   Wash your hands often if contact occurs.  There is no clear evidence that vitamin C, vitamin E, echinacea, or exercise reduces the chance of developing a cold. However, it is always recommended to get plenty of rest,  exercise, and practice good nutrition.  SEEK MEDICAL CARE IF:   You are getting worse rather than better.   Your symptoms are not controlled by medicine.   You have chills.  You have worsening shortness of breath.  You have brown or red mucus.  You have yellow or brown nasal discharge.  You have pain in your face, especially when you bend forward.  You have a fever.  You have swollen neck glands.  You have pain while swallowing.  You have white areas in the back of your throat. SEEK IMMEDIATE MEDICAL CARE IF:   You have severe or persistent:  Headache.  Ear pain.  Sinus pain.  Chest pain.  You have chronic lung disease and any of the following:  Wheezing.  Prolonged cough.  Coughing up blood.  A change in your usual mucus.  You have a stiff neck.  You have changes in your:  Vision.  Hearing.  Thinking.  Mood. MAKE SURE YOU:   Understand these instructions.  Will watch your condition.  Will get help right away if you are not doing well or get worse.   This information is not intended to replace advice given to you by your health care provider. Make sure you discuss any questions you have with your health care provider.   Document Released: 08/10/2000 Document Revised: 07/01/2014 Document Reviewed: 05/22/2013 Elsevier Interactive Patient Education [employer].

## 2015-03-04 NOTE — ED Notes (Signed)
Patient was alert, oriented and stable upon discharge. RN went over AVS and patient had no further questions.  

## 2015-03-04 NOTE — ED Notes (Signed)
Since Saturday pt complaining of runny nose, nasal congestion, cough, headache. No relief with OTC medication. Lungs clear in triage

## 2015-03-04 NOTE — ED Provider Notes (Signed)
CSN: [id number]     Arrival date & time 03/04/15  1637 History  By signing my name below, I, Rebecca Parks, attest that this documentation has been prepared under the direction and in the presence of  Rebecca PeekBenjamin Daily Crate, PA-C. Electronically Signed: [name], ED Scribe. 03/04/2015. 5:11 PM.     Chief Complaint  Patient presents with  . Nasal Congestion  . Cough   The history is provided by the patient. No language interpreter was used.    HPI Comments: Rebecca Parks is a 25 y.o. female otherwise healthy who presents to the Emergency Department complaining of moderate nasal congestion onset 4 days ago with associated cough, sneezing, rhinorrhea, sore throat, HA, sinus tenderness. Pt has taken Dayquil and Nyquil with no relief. Pt notes her boyfriend has similar symptoms, but she had symptoms first. No other sick contacts. Pt has had a flu shot this year. She denies fever, nausea, emesis, abdominal pain, additional symptoms.    History reviewed. No pertinent past medical history. History reviewed. No pertinent past surgical history. History reviewed. No pertinent family history. Social History  Substance Use Topics  . Smoking status: Former Smoker -- 0.50 packs/day    Types: Cigarettes    Quit date: 09/07/2014  . Smokeless tobacco: Never Used  . Alcohol Use: No   OB History    No data available     Review of Systems A 10 point review of systems was completed and was negative except for pertinent positives and negatives as mentioned in the history of present illness.   Allergies  Review of patient's allergies indicates no known allergies.  Home Medications   Prior to Admission medications   Medication Sig Start Date End Date Taking? Authorizing Provider  Ascorbic Acid (EQL VITAMIN C) 500 MG CHEW Chew 1,000 mg by mouth daily.    Historical Provider, MD  aspirin-acetaminophen-caffeine (EXCEDRIN MIGRAINE) [id number] MG per tablet Take 2 tablets by mouth every 6 (six) hours as  needed for migraine.    Historical Provider, MD  dextromethorphan (DELSYM) 30 MG/5ML liquid Take 2.5 mLs (15 mg total) by mouth 2 (two) times daily. 08/07/14   Earley FavorGail Schulz, NP  GINSENG PO Take 1 tablet by mouth daily.    Historical Provider, MD  ibuprofen (ADVIL,MOTRIN) 200 MG tablet Take 600 mg by mouth every 6 (six) hours as needed for headache.     Historical Provider, MD  ibuprofen (ADVIL,MOTRIN) 600 MG tablet Take 1 tablet (600 mg total) by mouth every 6 (six) hours as needed. 04/25/13   Cristobal GoldmannJacob Lackey, PA-C  methocarbamol (ROBAXIN) 750 MG tablet Take 1 tablet (750 mg total) by mouth 2 (two) times daily. 01/08/15   Ace GinsSerena Y Sam, PA-C  minocycline (MINOCIN,DYNACIN) 100 MG capsule Take 100 mg by mouth 2 (two) times daily.    Historical Provider, MD  Multiple Vitamin (MULTIVITAMIN WITH MINERALS) TABS tablet Take 1 tablet by mouth daily.    Historical Provider, MD  norgestimate-ethinyl estradiol (SPRINTEC 28) 0.25-35 MG-MCG tablet Take 1 tablet by mouth daily.    Historical Provider, MD  omeprazole (PRILOSEC) 20 MG capsule Take 20 mg by mouth daily.    Historical Provider, MD  predniSONE (DELTASONE) 20 MG tablet Take 3 tablets (60 mg total) by mouth daily. 01/08/15   Ace GinsSerena Y Sam, PA-C  pseudoephedrine-guaifenesin (MUCINEX D) 60-600 MG 12 hr tablet Take 1 tablet by mouth every 12 (twelve) hours. 03/04/15   Rebecca PeekBenjamin Airrion Otting, PA-C  sulfamethoxazole-trimethoprim (SEPTRA DS) 800-160 MG per tablet Take 1 tablet by  mouth every 12 (twelve) hours. 04/25/13   Cristobal Goldmann, PA-C   BP 139/74 mmHg  Pulse 92  Temp(Src) 98.2 F (36.8 C) (Oral)  Resp 16  SpO2 99% Physical Exam  Constitutional: She is oriented to person, place, and time. She appears well-developed and well-nourished.  Awake, alert, nontoxic appearance.    HENT:  Head: Normocephalic and atraumatic.  Mouth/Throat: Oropharynx is clear and moist. No oropharyngeal exudate.  Eyes: Conjunctivae and EOM are normal. Pupils are equal, round, and reactive  to light.  Neck: Normal range of motion. Neck supple.  Cardiovascular: Normal rate, regular rhythm and normal heart sounds.  Exam reveals no gallop and no friction rub.   No murmur heard. Heart sounds normal. RRR.    Pulmonary/Chest: Effort normal and breath sounds normal. No respiratory distress. She has no wheezes. She has no rales.  Lungs CTA bilaterally.   Abdominal: She exhibits no distension.  Musculoskeletal: Normal range of motion.  Lymphadenopathy:    She has no cervical adenopathy.  Neurological: She is alert and oriented to person, place, and time.  Skin: Skin is warm and dry.  Psychiatric: She has a normal mood and affect. Her behavior is normal.  Nursing note and vitals reviewed.  ED Course  Procedures (including critical care time) DIAGNOSTIC STUDIES: Oxygen Saturation is 99% on RA, normal by my interpretation.    COORDINATION OF CARE: 5:09 PM Discussed treatment plan with pt at bedside which includes symptomatic treatment and pt agreed to plan.    MDM   Final diagnoses:  URI (upper respiratory infection)   Filed Vitals:   03/04/15 1643  BP: 139/74  Pulse: 92  Temp: 98.2 F (36.8 C)  Resp: 16    Meds given in ED:  Medications - No data to display  Discharge Medication List as of 03/04/2015  5:18 PM    START taking these medications   Details  pseudoephedrine-guaifenesin (MUCINEX D) 60-600 MG 12 hr tablet Take 1 tablet by mouth every 12 (twelve) hours., Starting 03/04/2015, Until Discontinued, Print         Pt symptoms consistent with URI. CXR not indicated. Pt will be discharged with symptomatic treatment.  Discussed return precautions.  Pt is hemodynamically stable & in NAD prior to discharge.   I personally performed the services described in this documentation, which was scribed in my presence. The recorded information has been reviewed and is accurate.   Rebecca Peek, PA-C 03/04/15 1821  Lorre Nick, MD 03/05/15 1536

## 2015-07-22 DIAGNOSIS — S39012A Strain of muscle, fascia and tendon of lower back, initial encounter: Secondary | ICD-10-CM | POA: Diagnosis not present

## 2015-07-22 DIAGNOSIS — Y939 Activity, unspecified: Secondary | ICD-10-CM | POA: Insufficient documentation

## 2015-07-22 DIAGNOSIS — Y990 Civilian activity done for income or pay: Secondary | ICD-10-CM | POA: Insufficient documentation

## 2015-07-22 DIAGNOSIS — Z7952 Long term (current) use of systemic steroids: Secondary | ICD-10-CM | POA: Diagnosis not present

## 2015-07-22 DIAGNOSIS — Y929 Unspecified place or not applicable: Secondary | ICD-10-CM | POA: Diagnosis not present

## 2015-07-22 DIAGNOSIS — Z87891 Personal history of nicotine dependence: Secondary | ICD-10-CM | POA: Diagnosis not present

## 2015-07-22 DIAGNOSIS — X500XXA Overexertion from strenuous movement or load, initial encounter: Secondary | ICD-10-CM | POA: Diagnosis not present

## 2015-07-22 DIAGNOSIS — Z79899 Other long term (current) drug therapy: Secondary | ICD-10-CM | POA: Insufficient documentation

## 2015-07-22 DIAGNOSIS — S3992XA Unspecified injury of lower back, initial encounter: Secondary | ICD-10-CM | POA: Diagnosis present

## 2015-07-23 ENCOUNTER — Emergency Department (HOSPITAL_COMMUNITY)
Admission: EM | Admit: 2015-07-23 | Discharge: 2015-07-23 | Disposition: A | Discharge status: Home / Self Care | Payer: Worker's Compensation | Attending: Emergency Medicine | Admitting: Emergency Medicine

## 2015-07-23 ENCOUNTER — Encounter (HOSPITAL_COMMUNITY): Payer: Self-pay | Admitting: Nurse Practitioner

## 2015-07-23 DIAGNOSIS — S39012A Strain of muscle, fascia and tendon of lower back, initial encounter: Secondary | ICD-10-CM

## 2015-07-23 MED ORDER — METHOCARBAMOL 750 MG PO TABS: 750.0000 mg | ORAL_TABLET | Freq: Three times a day (TID) | ORAL | Status: DC

## 2015-07-23 MED ORDER — PREDNISONE 20 MG PO TABS
60.0000 mg | ORAL_TABLET | Freq: Once | ORAL | Status: AC
  Administered 2015-07-23: 60 mg via ORAL
  Filled 2015-07-23: qty 3

## 2015-07-23 MED ORDER — KETOROLAC TROMETHAMINE 30 MG/ML IJ SOLN
30.0000 mg | Freq: Once | INTRAMUSCULAR | Status: AC
  Administered 2015-07-23: 30 mg via INTRAMUSCULAR
  Filled 2015-07-23: qty 1

## 2015-07-23 MED ORDER — METHOCARBAMOL 500 MG PO TABS
750.0000 mg | ORAL_TABLET | Freq: Once | ORAL | Status: AC
  Administered 2015-07-23: 750 mg via ORAL
  Filled 2015-07-23: qty 2

## 2015-07-23 MED ORDER — PREDNISONE 20 MG PO TABS: ORAL_TABLET | ORAL | Status: DC

## 2015-07-23 NOTE — Discharge Instructions (Signed)
You have a muscle strain of your lower back.  You're being treated with an anti-inflammatory medication called prednisone.  Please take this as directed until all tablets have been completed.  Please try to avoid taking Advil or ibuprofen.  We can add Tylenol to your medication regime as needed .  It also been given a muscle relaxer.  Please take this as directed .  He been given a set of exercises that you can start doing in the next 3 days , as well as a referral to a primary care physician   Back Injury Prevention Back injuries can be very painful. They can also be difficult to heal. After having one back injury, you are more likely to injure your back again. It is important to learn how to avoid injuring or re-injuring your back. The following tips can help you to prevent a back injury. WHAT SHOULD I KNOW ABOUT PHYSICAL FITNESS?  Exercise for 30 minutes per day on most days of the week or as told by your doctor. Make sure to:  Do aerobic exercises, such as walking, jogging, biking, or swimming.  Do exercises that increase balance and strength, such as tai chi and yoga.  Do stretching exercises. This helps with flexibility.  Try to develop strong belly (abdominal) muscles. Your belly muscles help to support your back.  Stay at a healthy weight. This helps to decrease your risk of a back injury. WHAT SHOULD I KNOW ABOUT MY DIET?  Talk with your doctor about your overall diet. Take supplements and vitamins only as told by your doctor.  Talk with your doctor about how much calcium and vitamin D you need each day. These nutrients help to prevent weakening of the bones (osteoporosis).  Include good sources of calcium in your diet, such as:  Dairy products.  Green leafy vegetables.  Products that have had calcium added to them (fortified).  Include good sources of vitamin D in your diet, such as:  Milk.  Foods that have had vitamin D added to them. WHAT SHOULD I KNOW ABOUT MY  POSTURE?  Sit up straight and stand up straight. Avoid leaning forward when you sit or hunching over when you stand.  Choose chairs that have good low-back (lumbar) support.  If you work at a desk, sit close to it so you do not need to lean over. Keep your chin tucked in. Keep your neck drawn back. Keep your elbows bent so your arms look like the letter "L" (right angle).  Sit high and close to the steering wheel when you drive. Add a low-back support to your car seat, if needed.  Avoid sitting or standing in one position for very long. Take breaks to get up, stretch, and walk around at least one time every hour. Take breaks every hour if you are driving for long periods of time.  Sleep on your side with your knees slightly bent, or sleep on your back with a pillow under your knees. Do not lie on the front of your body to sleep. WHAT SHOULD I KNOW ABOUT LIFTING, TWISTING, AND REACHING Lifting and Heavy Lifting  Avoid heavy lifting, especially lifting over and over again. If you must do heavy lifting:  Stretch before lifting.  Work slowly.  Rest between lifts.  Use a tool such as a cart or a dolly to move objects if one is available.  Make several small trips instead of carrying one heavy load.  Ask for help when you need it,  especially when moving big objects.  Follow these steps when lifting:  Stand with your feet shoulder-width apart.  Get as close to the object as you can. Do not pick up a heavy object that is far from your body.  Use handles or lifting straps if they are available.  Bend at your knees. Squat down, but keep your heels off the floor.  Keep your shoulders back. Keep your chin tucked in. Keep your back straight.  Lift the object slowly while you tighten the muscles in your legs, belly, and butt. Keep the object as close to the center of your body as possible.  Follow these steps when putting down a heavy load:  Stand with your feet shoulder-width  apart.  Lower the object slowly while you tighten the muscles in your legs, belly, and butt. Keep the object as close to the center of your body as possible.  Keep your shoulders back. Keep your chin tucked in. Keep your back straight.  Bend at your knees. Squat down, but keep your heels off the floor.  Use handles or lifting straps if they are available. Twisting and Reaching  Avoid lifting heavy objects above your waist.  Do not twist at your waist while you are lifting or carrying a load. If you need to turn, move your feet.  Do not bend over without bending at your knees.  Avoid reaching over your head, across a table, or for an object on a high surface.  WHAT ARE SOME OTHER TIPS?  Avoid wet floors and icy ground. Keep sidewalks clear of ice to prevent falls.   Do not sleep on a mattress that is too soft or too hard.   Keep items that you use often within easy reach.   Put heavier objects on shelves at waist level, and put lighter objects on lower or higher shelves.  Find ways to lower your stress, such as:  Exercise.  Massage.  Relaxation techniques.  Talk with your doctor if you feel anxious or depressed. These conditions can make back pain worse.  Wear flat heel shoes with cushioned soles.  Avoid making quick (sudden) movements.  Use both shoulder straps when carrying a backpack.  Do not use any tobacco products, including cigarettes, chewing tobacco, or electronic cigarettes. If you need help quitting, ask your doctor.   This information is not intended to replace advice given to you by your health care provider. Make sure you discuss any questions you have with your health care provider.   Document Released: 08/03/2007 Document Revised: 07/01/2014 Document Reviewed: 02/18/2014 [employer] Interactive Patient Education 2016 [place] Strain With Rehab A strain is an injury in which a tendon or muscle is torn. The muscles and tendons of the  lower back are vulnerable to strains. However, these muscles and tendons are very strong and require a great force to be injured. Strains are classified into three categories. Grade 1 strains cause pain, but the tendon is not lengthened. Grade 2 strains include a lengthened ligament, due to the ligament being stretched or partially ruptured. With grade 2 strains there is still function, although the function may be decreased. Grade 3 strains involve a complete tear of the tendon or muscle, and function is usually impaired. SYMPTOMS   Pain in the lower back.  Pain that affects one side more than the other.  Pain that gets worse with movement and may be felt in the hip, buttocks, or back of the thigh.  Muscle  spasms of the muscles in the back.  Swelling along the muscles of the back.  Loss of strength of the back muscles.  Crackling sound (crepitation) when the muscles are touched. CAUSES  Lower back strains occur when a force is placed on the muscles or tendons that is greater than they can handle. Common causes of injury include:  Prolonged overuse of the muscle-tendon units in the lower back, usually from incorrect posture.  A single violent injury or force applied to the back. RISK INCREASES WITH:  Sports that involve twisting forces on the spine or a lot of bending at the waist (football, rugby, weightlifting, bowling, golf, tennis, speed skating, racquetball, swimming, running, gymnastics, diving).  Poor strength and flexibility.  Failure to warm up properly before activity.  Family history of lower back pain or disk disorders.  Previous back injury or surgery (especially fusion).  Poor posture with lifting, especially heavy objects.  Prolonged sitting, especially with poor posture. PREVENTION   Learn and use proper posture when sitting or lifting (maintain proper posture when sitting, lift using the knees and legs, not at the waist).  Warm up and stretch properly before  activity.  Allow for adequate recovery between workouts.  Maintain physical fitness:  Strength, flexibility, and endurance.  Cardiovascular fitness. PROGNOSIS  If treated properly, lower back strains usually heal within 6 weeks. RELATED COMPLICATIONS   Recurring symptoms, resulting in a chronic problem.  Chronic inflammation, scarring, and partial muscle-tendon tear.  Delayed healing or resolution of symptoms.  Prolonged disability. TREATMENT  Treatment first involves the use of ice and medicine, to reduce pain and inflammation. The use of strengthening and stretching exercises may help reduce pain with activity. These exercises may be performed at home or with a therapist. Severe injuries may require referral to a therapist for further evaluation and treatment, such as ultrasound. Your caregiver may advise that you wear a back brace or corset, to help reduce pain and discomfort. Often, prolonged bed rest results in greater harm then benefit. Corticosteroid injections may be recommended. However, these should be reserved for the most serious cases. It is important to avoid using your back when lifting objects. At night, sleep on your back on a firm mattress with a pillow placed under your knees. If non-surgical treatment is unsuccessful, surgery may be needed.  MEDICATION   If pain medicine is needed, nonsteroidal anti-inflammatory medicines (aspirin and ibuprofen), or other minor pain relievers (acetaminophen), are often advised.  Do not take pain medicine for 7 days before surgery.  Prescription pain relievers may be given, if your caregiver thinks they are needed. Use only as directed and only as much as you need.  Ointments applied to the skin may be helpful.  Corticosteroid injections may be given by your caregiver. These injections should be reserved for the most serious cases, because they may only be given a certain number of times. HEAT AND COLD  Cold treatment (icing)  should be applied for 10 to 15 minutes every 2 to 3 hours for inflammation and pain, and immediately after activity that aggravates your symptoms. Use ice packs or an ice massage.  Heat treatment may be used before performing stretching and strengthening activities prescribed by your caregiver, physical therapist, or athletic trainer. Use a heat pack or a warm water soak. SEEK MEDICAL CARE IF:   Symptoms get worse or do not improve in 2 to 4 weeks, despite treatment.  You develop numbness, weakness, or loss of bowel or bladder function.  New, unexplained symptoms develop. (Drugs used in treatment may produce side effects.) EXERCISES  RANGE OF MOTION (ROM) AND STRETCHING EXERCISES - Low Back Strain Most people with lower back pain will find that their symptoms get worse with excessive bending forward (flexion) or arching at the lower back (extension). The exercises which will help resolve your symptoms will focus on the opposite motion.  Your physician, physical therapist or athletic trainer will help you determine which exercises will be most helpful to resolve your lower back pain. Do not complete any exercises without first consulting with your caregiver. Discontinue any exercises which make your symptoms worse until you speak to your caregiver.  If you have pain, numbness or tingling which travels down into your buttocks, leg or foot, the goal of the therapy is for these symptoms to move closer to your back and eventually resolve. Sometimes, these leg symptoms will get better, but your lower back pain may worsen. This is typically an indication of progress in your rehabilitation. Be very alert to any changes in your symptoms and the activities in which you participated in the 24 hours prior to the change. Sharing this information with your caregiver will allow him/her to most efficiently treat your condition.  These exercises may help you when beginning to rehabilitate your injury. Your symptoms  may resolve with or without further involvement from your physician, physical therapist or athletic trainer. While completing these exercises, remember:  Restoring tissue flexibility helps normal motion to return to the joints. This allows healthier, less painful movement and activity.  An effective stretch should be held for at least 30 seconds.  A stretch should never be painful. You should only feel a gentle lengthening or release in the stretched tissue. FLEXION RANGE OF MOTION AND STRETCHING EXERCISES: STRETCH - Flexion, Single Knee to Chest   Lie on a firm bed or floor with both legs extended in front of you.  Keeping one leg in contact with the floor, bring your opposite knee to your chest. Hold your leg in place by either grabbing behind your thigh or at your knee.  Pull until you feel a gentle stretch in your lower back. Hold __________ seconds.  Slowly release your grasp and repeat the exercise with the opposite side. Repeat __________ times. Complete this exercise __________ times per day.  STRETCH - Flexion, Double Knee to Chest   Lie on a firm bed or floor with both legs extended in front of you.  Keeping one leg in contact with the floor, bring your opposite knee to your chest.  Tense your stomach muscles to support your back and then lift your other knee to your chest. Hold your legs in place by either grabbing behind your thighs or at your knees.  Pull both knees toward your chest until you feel a gentle stretch in your lower back. Hold __________ seconds.  Tense your stomach muscles and slowly return one leg at a time to the floor. Repeat __________ times. Complete this exercise __________ times per day.  STRETCH - Low Trunk Rotation  Lie on a firm bed or floor. Keeping your legs in front of you, bend your knees so they are both pointed toward the ceiling and your feet are flat on the floor.  Extend your arms out to the side. This will stabilize your upper body by  keeping your shoulders in contact with the floor.  Gently and slowly drop both knees together to one side until you feel a gentle stretch in  your lower back. Hold for __________ seconds.  Tense your stomach muscles to support your lower back as you bring your knees back to the starting position. Repeat the exercise to the other side. Repeat __________ times. Complete this exercise __________ times per day  EXTENSION RANGE OF MOTION AND FLEXIBILITY EXERCISES: STRETCH - Extension, Prone on Elbows   Lie on your stomach on the floor, a bed will be too soft. Place your palms about shoulder width apart and at the height of your head.  Place your elbows under your shoulders. If this is too painful, stack pillows under your chest.  Allow your body to relax so that your hips drop lower and make contact more completely with the floor.  Hold this position for __________ seconds.  Slowly return to lying flat on the floor. Repeat __________ times. Complete this exercise __________ times per day.  RANGE OF MOTION - Extension, Prone Press Ups  Lie on your stomach on the floor, a bed will be too soft. Place your palms about shoulder width apart and at the height of your head.  Keeping your back as relaxed as possible, slowly straighten your elbows while keeping your hips on the floor. You may adjust the placement of your hands to maximize your comfort. As you gain motion, your hands will come more underneath your shoulders.  Hold this position __________ seconds.  Slowly return to lying flat on the floor. Repeat __________ times. Complete this exercise __________ times per day.  RANGE OF MOTION- Quadruped, Neutral Spine   Assume a hands and knees position on a firm surface. Keep your hands under your shoulders and your knees under your hips. You may place padding under your knees for comfort.  Drop your head and point your tail bone toward the ground below you. This will round out your lower back  like an angry cat. Hold this position for __________ seconds.  Slowly lift your head and release your tail bone so that your back sags into a large arch, like an old horse.  Hold this position for __________ seconds.  Repeat this until you feel limber in your lower back.  Now, find your "sweet spot." This will be the most comfortable position somewhere between the two previous positions. This is your neutral spine. Once you have found this position, tense your stomach muscles to support your lower back.  Hold this position for __________ seconds. Repeat __________ times. Complete this exercise __________ times per day.  STRENGTHENING EXERCISES - Low Back Strain These exercises may help you when beginning to rehabilitate your injury. These exercises should be done near your "sweet spot." This is the neutral, low-back arch, somewhere between fully rounded and fully arched, that is your least painful position. When performed in this safe range of motion, these exercises can be used for people who have either a flexion or extension based injury. These exercises may resolve your symptoms with or without further involvement from your physician, physical therapist or athletic trainer. While completing these exercises, remember:   Muscles can gain both the endurance and the strength needed for everyday activities through controlled exercises.  Complete these exercises as instructed by your physician, physical therapist or athletic trainer. Increase the resistance and repetitions only as guided.  You may experience muscle soreness or fatigue, but the pain or discomfort you are trying to eliminate should never worsen during these exercises. If this pain does worsen, stop and make certain you are following the directions exactly. If the pain  is still present after adjustments, discontinue the exercise until you can discuss the trouble with your caregiver. STRENGTHENING - Deep Abdominals, Pelvic Tilt  Lie  on a firm bed or floor. Keeping your legs in front of you, bend your knees so they are both pointed toward the ceiling and your feet are flat on the floor.  Tense your lower abdominal muscles to press your lower back into the floor. This motion will rotate your pelvis so that your tail bone is scooping upwards rather than pointing at your feet or into the floor.  With a gentle tension and even breathing, hold this position for __________ seconds. Repeat __________ times. Complete this exercise __________ times per day.  STRENGTHENING - Abdominals, Crunches   Lie on a firm bed or floor. Keeping your legs in front of you, bend your knees so they are both pointed toward the ceiling and your feet are flat on the floor. Cross your arms over your chest.  Slightly tip your chin down without bending your neck.  Tense your abdominals and slowly lift your trunk high enough to just clear your shoulder blades. Lifting higher can put excessive stress on the lower back and does not further strengthen your abdominal muscles.  Control your return to the starting position. Repeat __________ times. Complete this exercise __________ times per day.  STRENGTHENING - Quadruped, Opposite UE/LE Lift   Assume a hands and knees position on a firm surface. Keep your hands under your shoulders and your knees under your hips. You may place padding under your knees for comfort.  Find your neutral spine and gently tense your abdominal muscles so that you can maintain this position. Your shoulders and hips should form a rectangle that is parallel with the floor and is not twisted.  Keeping your trunk steady, lift your right hand no higher than your shoulder and then your left leg no higher than your hip. Make sure you are not holding your breath. Hold this position __________ seconds.  Continuing to keep your abdominal muscles tense and your back steady, slowly return to your starting position. Repeat with the opposite arm  and leg. Repeat __________ times. Complete this exercise __________ times per day.  STRENGTHENING - Lower Abdominals, Double Knee Lift  Lie on a firm bed or floor. Keeping your legs in front of you, bend your knees so they are both pointed toward the ceiling and your feet are flat on the floor.  Tense your abdominal muscles to brace your lower back and slowly lift both of your knees until they come over your hips. Be certain not to hold your breath.  Hold __________ seconds. Using your abdominal muscles, return to the starting position in a slow and controlled manner. Repeat __________ times. Complete this exercise __________ times per day.  POSTURE AND BODY MECHANICS CONSIDERATIONS - Low Back Strain Keeping correct posture when sitting, standing or completing your activities will reduce the stress put on different body tissues, allowing injured tissues a chance to heal and limiting painful experiences. The following are general guidelines for improved posture. Your physician or physical therapist will provide you with any instructions specific to your needs. While reading these guidelines, remember:  The exercises prescribed by your provider will help you have the flexibility and strength to maintain correct postures.  The correct posture provides the best environment for your joints to work. All of your joints have less wear and tear when properly supported by a spine with good posture. This means you  will experience a healthier, less painful body.  Correct posture must be practiced with all of your activities, especially prolonged sitting and standing. Correct posture is as important when doing repetitive low-stress activities (typing) as it is when doing a single heavy-load activity (lifting). RESTING POSITIONS Consider which positions are most painful for you when choosing a resting position. If you have pain with flexion-based activities (sitting, bending, stooping, squatting), choose a  position that allows you to rest in a less flexed posture. You would want to avoid curling into a fetal position on your side. If your pain worsens with extension-based activities (prolonged standing, working overhead), avoid resting in an extended position such as sleeping on your stomach. Most people will find more comfort when they rest with their spine in a more neutral position, neither too rounded nor too arched. Lying on a non-sagging bed on your side with a pillow between your knees, or on your back with a pillow under your knees will often provide some relief. Keep in mind, being in any one position for a prolonged period of time, no matter how correct your posture, can still lead to stiffness. PROPER SITTING POSTURE In order to minimize stress and discomfort on your spine, you must sit with correct posture. Sitting with good posture should be effortless for a healthy body. Returning to good posture is a gradual process. Many people can work toward this most comfortably by using various supports until they have the flexibility and strength to maintain this posture on their own. When sitting with proper posture, your ears will fall over your shoulders and your shoulders will fall over your hips. You should use the back of the chair to support your upper back. Your lower back will be in a neutral position, just slightly arched. You may place a small pillow or folded towel at the base of your lower back for support.  When working at a desk, create an environment that supports good, upright posture. Without extra support, muscles tire, which leads to excessive strain on joints and other tissues. Keep these recommendations in mind: CHAIR:  A chair should be able to slide under your desk when your back makes contact with the back of the chair. This allows you to work closely.  The chair's height should allow your eyes to be level with the upper part of your monitor and your hands to be slightly lower  than your elbows. BODY POSITION  Your feet should make contact with the floor. If this is not possible, use a foot rest.  Keep your ears over your shoulders. This will reduce stress on your neck and lower back. INCORRECT SITTING POSTURES  If you are feeling tired and unable to assume a healthy sitting posture, do not slouch or slump. This puts excessive strain on your back tissues, causing more damage and pain. Healthier options include:  Using more support, like a lumbar pillow.  Switching tasks to something that requires you to be upright or walking.  Talking a brief walk.  Lying down to rest in a neutral-spine position. PROLONGED STANDING WHILE SLIGHTLY LEANING FORWARD  When completing a task that requires you to lean forward while standing in one place for a long time, place either foot up on a stationary 2-4 inch high object to help maintain the best posture. When both feet are on the ground, the lower back tends to lose its slight inward curve. If this curve flattens (or becomes too large), then the back and your other  joints will experience too much stress, tire more quickly, and can cause pain. CORRECT STANDING POSTURES Proper standing posture should be assumed with all daily activities, even if they only take a few moments, like when brushing your teeth. As in sitting, your ears should fall over your shoulders and your shoulders should fall over your hips. You should keep a slight tension in your abdominal muscles to brace your spine. Your tailbone should point down to the ground, not behind your body, resulting in an over-extended swayback posture.  INCORRECT STANDING POSTURES  Common incorrect standing postures include a forward head, locked knees and/or an excessive swayback. WALKING Walk with an upright posture. Your ears, shoulders and hips should all line-up. PROLONGED ACTIVITY IN A FLEXED POSITION When completing a task that requires you to bend forward at your waist or lean  over a low surface, try to find a way to stabilize 3 out of 4 of your limbs. You can place a hand or elbow on your thigh or rest a knee on the surface you are reaching across. This will provide you more stability so that your muscles do not fatigue as quickly. By keeping your knees relaxed, or slightly bent, you will also reduce stress across your lower back. CORRECT LIFTING TECHNIQUES DO :   Assume a wide stance. This will provide you more stability and the opportunity to get as close as possible to the object which you are lifting.  Tense your abdominals to brace your spine. Bend at the knees and hips. Keeping your back locked in a neutral-spine position, lift using your leg muscles. Lift with your legs, keeping your back straight.  Test the weight of unknown objects before attempting to lift them.  Try to keep your elbows locked down at your sides in order get the best strength from your shoulders when carrying an object.  Always ask for help when lifting heavy or awkward objects. INCORRECT LIFTING TECHNIQUES DO NOT:   Lock your knees when lifting, even if it is a small object.  Bend and twist. Pivot at your feet or move your feet when needing to change directions.  Assume that you can safely pick up even a paper clip without proper posture.   This information is not intended to replace advice given to you by your health care provider. Make sure you discuss any questions you have with your health care provider.   Document Released: 02/14/2005 Document Revised: 03/07/2014 Document Reviewed: 05/29/2008 Elsevier Interactive Patient Education [employer].

## 2015-07-23 NOTE — ED Notes (Signed)
Pt states that while at work, she lifted a rack with heavy material when she heard and felt "pop in her lower middle back, then she experienced sharp shooting pain that radiated to her neck and to her legs bilaterally." C/o numbness and tingling in her both feet.

## 2015-07-23 NOTE — ED Provider Notes (Signed)
CSN: [id number]     Arrival date & time 07/22/15  2347 History   First MD Initiated Contact with Patient 07/23/15 0129     Chief Complaint  Patient presents with  . Back Pain     (Consider location/radiation/quality/duration/timing/severity/associated sxs/prior Treatment) HPI Comments: This 25 year old female who states at work.  She was lifting a rack into an autoclave when she felt a pop in her back with pain that radiated up and down her spine into her legs.  She denies any loss of continence.  Inability to walk.  She has not taken any medication prior to arrival for discomfort.  She admits that several years ago with similar type pain with no sequela.  She is to last menstrual cycle was approximately 2 weeks ago, normal.  Patient is a 25 y.o. female presenting with back pain. The history is provided by the patient.  Back Pain Location:  Lumbar spine Quality:  Aching Radiates to:  L thigh and R thigh Pain severity:  Moderate Pain is:  Same all the time Onset quality:  Sudden Timing:  Constant Progression:  Unchanged Chronicity:  Recurrent Context: lifting heavy objects   Relieved by:  Nothing Worsened by:  Movement Ineffective treatments:  None tried Associated symptoms: no abdominal pain, no dysuria, no fever, no numbness and no weakness     History reviewed. No pertinent past medical history. History reviewed. No pertinent past surgical history. History reviewed. No pertinent family history. Social History  Substance Use Topics  . Smoking status: Former Smoker -- 0.50 packs/day    Types: Cigarettes    Quit date: 09/07/2014  . Smokeless tobacco: Never Used  . Alcohol Use: No   OB History    No data available     Review of Systems  Constitutional: Negative for fever and chills.  Respiratory: Negative for shortness of breath.   Gastrointestinal: Negative for nausea, vomiting and abdominal pain.  Genitourinary: Negative for dysuria and frequency.  Musculoskeletal:  Positive for back pain and arthralgias.  Skin: Negative for rash and wound.  Neurological: Negative for weakness and numbness.  All other systems reviewed and are negative.     Allergies  Review of patient's allergies indicates no known allergies.  Home Medications   Prior to Admission medications   Medication Sig Start Date End Date Taking? Authorizing Provider  methocarbamol (ROBAXIN) 750 MG tablet Take 1 tablet (750 mg total) by mouth 3 (three) times daily. 07/23/15   Earley FavorGail Jovan Schickling, NP  predniSONE (DELTASONE) 20 MG tablet 3 Tabs PO Days 1-3, then 2 tabs PO Days 4-6, then 1 tab PO Day 7-9, then Half Tab PO Day 10-12 07/23/15   Earley FavorGail Xzavior Reinig, NP  pseudoephedrine-guaifenesin (MUCINEX D) 60-600 MG 12 hr tablet Take 1 tablet by mouth every 12 (twelve) hours. Patient not taking: Reported on 07/23/2015 03/04/15   Joycie PeekBenjamin Cartner, PA-C   BP 127/80 mmHg  Pulse 82  Temp(Src) 98.1 F (36.7 C) (Oral)  Resp 20  Ht 5\' 1"  (1.549 m)  Wt 83.915 kg  BMI 34.97 kg/m2  SpO2 100%  LMP 07/16/2015 (Approximate) Physical Exam  Constitutional: She appears well-developed and well-nourished.  HENT:  Head: Normocephalic.  Eyes: Pupils are equal, round, and reactive to light.  Neck: Normal range of motion.  Cardiovascular: Normal rate and regular rhythm.   Pulmonary/Chest: Effort normal.  Abdominal: Soft. Bowel sounds are normal.  Musculoskeletal: Normal range of motion. She exhibits tenderness. She exhibits no edema.       Back:  Relation  equal and steady.  Patient has moderate discomfort with bilateral leg raises  Neurological: She is alert.  Skin: Skin is warm.  Nursing note and vitals reviewed.   ED Course  Procedures (including critical care time) Labs Review Labs Reviewed - No data to display  Imaging Review No results found. I have personally reviewed and evaluated these images and lab results as part of my medical decision-making.   EKG Interpretation None      MDM   Final  diagnoses:  Lumbar strain, initial encounter         Earley Favor, NP 07/23/15 7829  Derwood Kaplan, MD 07/23/15 2303

## 2016-09-21 ENCOUNTER — Emergency Department (HOSPITAL_COMMUNITY): Payer: Commercial Managed Care - PPO

## 2016-09-21 ENCOUNTER — Encounter (HOSPITAL_COMMUNITY): Payer: Self-pay | Admitting: *Deleted

## 2016-09-21 ENCOUNTER — Emergency Department (HOSPITAL_COMMUNITY)
Admission: EM | Admit: 2016-09-21 | Discharge: 2016-09-21 | Disposition: A | Discharge status: Home / Self Care | Payer: Commercial Managed Care - PPO | Attending: Emergency Medicine | Admitting: Emergency Medicine

## 2016-09-21 DIAGNOSIS — Z79899 Other long term (current) drug therapy: Secondary | ICD-10-CM | POA: Diagnosis not present

## 2016-09-21 DIAGNOSIS — R509 Fever, unspecified: Secondary | ICD-10-CM

## 2016-09-21 DIAGNOSIS — Z87891 Personal history of nicotine dependence: Secondary | ICD-10-CM | POA: Diagnosis not present

## 2016-09-21 DIAGNOSIS — R079 Chest pain, unspecified: Secondary | ICD-10-CM | POA: Diagnosis present

## 2016-09-21 LAB — CBC WITH DIFFERENTIAL/PLATELET
Basophils Absolute: 0 10*3/uL (ref 0.0–0.1)
Basophils Relative: 0 %
EOS PCT: 1 %
Eosinophils Absolute: 0.1 10*3/uL (ref 0.0–0.7)
HCT: 36.4 % (ref 36.0–46.0)
Hemoglobin: 12.6 g/dL (ref 12.0–15.0)
LYMPHS ABS: 2.5 10*3/uL (ref 0.7–4.0)
LYMPHS PCT: 22 %
MCH: 31.2 pg (ref 26.0–34.0)
MCHC: 34.6 g/dL (ref 30.0–36.0)
MCV: 90.1 fL (ref 78.0–100.0)
Monocytes Absolute: 0.4 10*3/uL (ref 0.1–1.0)
Monocytes Relative: 4 %
Neutro Abs: 8.2 10*3/uL — ABNORMAL HIGH (ref 1.7–7.7)
Neutrophils Relative %: 73 %
Platelets: 273 10*3/uL (ref 150–400)
RBC: 4.04 MIL/uL (ref 3.87–5.11)
RDW: 12.4 % (ref 11.5–15.5)
WBC: 11.1 10*3/uL — AB (ref 4.0–10.5)

## 2016-09-21 LAB — COMPREHENSIVE METABOLIC PANEL
ALBUMIN: 3.8 g/dL (ref 3.5–5.0)
ALT: 11 U/L — ABNORMAL LOW (ref 14–54)
AST: 18 U/L (ref 15–41)
Alkaline Phosphatase: 52 U/L (ref 38–126)
Anion gap: 11 (ref 5–15)
BUN: 6 mg/dL (ref 6–20)
CO2: 24 mmol/L (ref 22–32)
Calcium: 9 mg/dL (ref 8.9–10.3)
Chloride: 102 mmol/L (ref 101–111)
Creatinine, Ser: 0.75 mg/dL (ref 0.44–1.00)
GFR calc Af Amer: >60 mL/min (ref >60)
GFR calc non Af Amer: >60 mL/min (ref >60)
Glucose, Bld: 137 mg/dL — ABNORMAL HIGH (ref 65–99)
POTASSIUM: 3.5 mmol/L (ref 3.5–5.1)
SODIUM: 137 mmol/L (ref 135–145)
Total Bilirubin: 0.7 mg/dL (ref 0.3–1.2)
Total Protein: 7.6 g/dL (ref 6.5–8.1)

## 2016-09-21 LAB — I-STAT CG4 LACTIC ACID, ED
LACTIC ACID, VENOUS: 1.12 mmol/L (ref 0.5–1.9)
Lactic Acid, Venous: 2.15 mmol/L (ref 0.5–1.9)

## 2016-09-21 LAB — URINALYSIS, ROUTINE W REFLEX MICROSCOPIC
Bilirubin Urine: NEGATIVE
GLUCOSE, UA: NEGATIVE mg/dL
HGB URINE DIPSTICK: NEGATIVE
Ketones, ur: NEGATIVE mg/dL
Leukocytes, UA: NEGATIVE
Nitrite: NEGATIVE
Protein, ur: NEGATIVE mg/dL
Specific Gravity, Urine: 1.02 (ref 1.005–1.030)
pH: 5 (ref 5.0–8.0)

## 2016-09-21 LAB — I-STAT BETA HCG BLOOD, ED (MC, WL, AP ONLY): I-stat hCG, quantitative: <5 m[IU]/mL (ref <5)

## 2016-09-21 MED ORDER — ACETAMINOPHEN 325 MG PO TABS
650.0000 mg | ORAL_TABLET | Freq: Once | ORAL | Status: AC
  Administered 2016-09-21: 650 mg via ORAL
  Filled 2016-09-21: qty 2

## 2016-09-21 MED ORDER — ACETAMINOPHEN 325 MG PO TABS
ORAL_TABLET | ORAL | Status: AC
  Filled 2016-09-21: qty 2

## 2016-09-21 MED ORDER — ONDANSETRON HCL 4 MG/2ML IJ SOLN
4.0000 mg | Freq: Once | INTRAMUSCULAR | Status: AC
  Administered 2016-09-21: 4 mg via INTRAVENOUS
  Filled 2016-09-21: qty 2

## 2016-09-21 MED ORDER — SODIUM CHLORIDE 0.9 % IV BOLUS (SEPSIS)
3000.0000 mL | Freq: Once | INTRAVENOUS | Status: AC
  Administered 2016-09-21: 3000 mL via INTRAVENOUS

## 2016-09-21 MED ORDER — ACETAMINOPHEN 325 MG PO TABS
650.0000 mg | ORAL_TABLET | Freq: Once | ORAL | Status: AC
  Administered 2016-09-21: 650 mg via ORAL

## 2016-09-21 MED ORDER — IOPAMIDOL (ISOVUE-300) INJECTION 61%
INTRAVENOUS | Status: AC
  Administered 2016-09-21: 100 mL
  Filled 2016-09-21: qty 100

## 2016-09-21 NOTE — Discharge Instructions (Signed)
Get plenty of rest, and drink a lot of fluids.  Take Tylenol every 4 hours for fever.  Return here or see your doctor if not better in 2 or 3 days.  Also return sooner if needed for changing symptoms.

## 2016-09-21 NOTE — ED Provider Notes (Signed)
[facility]-EMERGENCY DEPT Provider Note   CSN: [id number] Arrival date & time: 09/21/16  7829     History   Chief Complaint Chief Complaint  Patient presents with  . Chest Pain  . Fever    HPI Rebecca Parks is a 26 y.o. female.  She complains of fever which started yesterday.  She has periods of feeling hot and cold.  She also has pain in chest, right arm and crampy lower abdominal pain.  She denies dysuria or urinary frequency, constipation, diarrhea, vaginal bleeding or missed menses.  No prior similar problems.  No known sick contacts.  She has not tried anything for the discomfort, yet.  There are no other known modifying factors.   HPI  History reviewed. No pertinent past medical history.  There are no active problems to display for this patient.   History reviewed. No pertinent surgical history.  OB History    No data available       Home Medications    Prior to Admission medications   Medication Sig Start Date End Date Taking? Authorizing Provider  cetirizine (ZYRTEC) 10 MG tablet Take 10 mg by mouth daily.   Yes [provider]  citalopram (CELEXA) 20 MG tablet Take 20 mg by mouth daily.   Yes [provider]  Prenatal Vit-Fe Fumarate-FA (MULTIVITAMIN-PRENATAL) 27-0.8 MG TABS tablet Take 1 tablet by mouth daily at 12 noon.   Yes [provider]  methocarbamol (ROBAXIN) 750 MG tablet Take 1 tablet (750 mg total) by mouth 3 (three) times daily. Patient not taking: Reported on 09/21/2016 07/23/15   Earley Favor, NP  predniSONE (DELTASONE) 20 MG tablet 3 Tabs PO Days 1-3, then 2 tabs PO Days 4-6, then 1 tab PO Day 7-9, then Half Tab PO Day 10-12 Patient not taking: Reported on 09/21/2016 07/23/15   Earley Favor, NP  pseudoephedrine-guaifenesin [facility] D) 60-600 MG 12 hr tablet Take 1 tablet by mouth every 12 (twelve) hours. Patient not taking: Reported on 07/23/2015 03/04/15   Joycie Peek, PA-C    Family History No family history on  file.  Social History Social History  Substance Use Topics  . Smoking status: Former Smoker    Packs/day: 0.50    Types: Cigarettes    Quit date: 09/07/2014  . Smokeless tobacco: Never Used  . Alcohol use No     Allergies   Patient has no known allergies.   Review of Systems Review of Systems  All other systems reviewed and are negative.    Physical Exam Updated Vital Signs BP 119/61   Pulse 92   Temp 99.5 F (37.5 C)   Resp 11   Ht 5\' 2"  (1.575 m)   Wt 83.9 kg (185 lb)   LMP 08/29/2016   SpO2 97%   BMI 33.84 kg/m   Physical Exam  Constitutional: She is oriented to person, place, and time. She appears well-developed and well-nourished.  HENT:  Head: Normocephalic and atraumatic.  Right Ear: External ear normal.  Left Ear: External ear normal.  Eyes: Pupils are equal, round, and reactive to light. Conjunctivae and EOM are normal.  Neck: Normal range of motion and phonation normal. Neck supple.  Cardiovascular: Normal rate, regular rhythm and normal heart sounds.   Pulmonary/Chest: Effort normal and breath sounds normal. She exhibits no bony tenderness.  Abdominal: Soft. She exhibits no mass. There is tenderness (Right greater than left lower quadrant tenderness, mild). There is no rebound and no guarding.  Genitourinary:  Genitourinary Comments: Right  sided costovertebral angle tenderness, with percussion  Musculoskeletal: Normal range of motion.  Neurological: She is alert and oriented to person, place, and time. No cranial nerve deficit or sensory deficit. She exhibits normal muscle tone. Coordination normal.  Skin: Skin is warm, dry and intact.  Psychiatric: She has a normal mood and affect. Her behavior is normal. Judgment and thought content normal.  Nursing note and vitals reviewed.    ED Treatments / Results  Labs (all labs ordered are listed, but only abnormal results are displayed) Labs Reviewed  COMPREHENSIVE METABOLIC PANEL - Abnormal; Notable  for the following:       Result Value   Glucose, Bld 137 (*)    ALT 11 (*)    All other components within normal limits  CBC WITH DIFFERENTIAL/PLATELET - Abnormal; Notable for the following:    WBC 11.1 (*)    Neutro Abs 8.2 (*)    All other components within normal limits  URINALYSIS, ROUTINE W REFLEX MICROSCOPIC - Abnormal; Notable for the following:    APPearance HAZY (*)    All other components within normal limits  I-STAT CG4 LACTIC ACID, ED - Abnormal; Notable for the following:    Lactic Acid, Venous 2.15 (*)    All other components within normal limits  I-STAT BETA HCG BLOOD, ED ([facility], [facility], [facility] ONLY)  I-STAT CG4 LACTIC ACID, ED    EKG  EKG Interpretation  Date/Time:  Wednesday September 21 2016 08:24:53 EDT Ventricular Rate:  103 PR Interval:  138 QRS Duration: 78 QT Interval:  324 QTC Calculation: 424 R Axis:   91 Text Interpretation:  Sinus tachycardia Rightward axis Borderline ECG Since last tracing rate faster Confirmed by Mancel BaleWentz, Mazen Marcin [phone]([id number]) on 09/21/2016 9:24:12 AM       Radiology Dg Chest 2 View  Result Date: 09/21/2016 CLINICAL DATA:  Right-sided chest pain and fever for 2 days. EXAM: CHEST  2 VIEW COMPARISON:  08/07/2014 FINDINGS: Grossly unchanged cardiac silhouette and mediastinal contours. No focal parenchymal opacities. No pleural effusion or pneumothorax. No evidence of edema. No acute osseus abnormalities. IMPRESSION: No acute cardiopulmonary disease. Electronically Signed   By: Simonne ComeJohn  Watts M.D.   On: 09/21/2016 09:01   Ct Abdomen Pelvis W Contrast  Result Date: 09/21/2016 CLINICAL DATA:  Fever, right-sided abdominal pain. EXAM: CT ABDOMEN AND PELVIS WITH CONTRAST TECHNIQUE: Multidetector CT imaging of the abdomen and pelvis was performed using the standard protocol following bolus administration of intravenous contrast. CONTRAST:  100mL ISOVUE-300 IOPAMIDOL (ISOVUE-300) INJECTION 61% COMPARISON:  None. FINDINGS: Lower chest: There is a 5 mm pulmonary nodule  in the lingula (image 1, series 5). The lung bases are otherwise clear. The heart is normal in size. No pericardial effusion. Hepatobiliary: No focal liver abnormality is seen. No gallstones, gallbladder wall thickening, or biliary dilatation. Pancreas: Unremarkable. No pancreatic ductal dilatation or surrounding inflammatory changes. Spleen: Normal in size without focal abnormality. Adrenals/Urinary Tract: Adrenal glands are unremarkable. Kidneys are normal, without renal calculi, focal lesion, or hydronephrosis. Bladder is unremarkable. Stomach/Bowel: Stomach is within normal limits. Appendix appears normal. No evidence of bowel wall thickening, distention, or inflammatory changes. Vascular/Lymphatic: No significant vascular findings are present. No enlarged abdominal or pelvic lymph nodes. There is a mildly enlarged left inguinal lymph node measuring up to 1.4 cm in short axis. Reproductive: Uterus and bilateral adnexa are unremarkable. Other: Small fat containing umbilical hernia.  No ascites. Musculoskeletal: Bilateral L5 pars defects with associated 8 mm anterolisthesis of L5 on S1. IMPRESSION: 1. No  acute intra-abdominal process. 2. Mildly enlarged solitary left inguinal lymph node. Recommend follow-up ultrasound in 3 months to evaluate for interval change. This recommendation follows ACR consensus guidelines: White Paper of the ACR Incidental Findings Committee II on Splenic and Nodal Findings. J Am Coll Radiol [id number];10:789-794. 3. Partially visualized 5 mm pulmonary nodule in the lingula, likely postinflammatory given patient's age. 4. 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 Electronically Signed   By: Obie DredgeWilliam T Derry M.D.   On: 09/21/2016 11:10    Procedures Procedures (including critical care time)  Medications Ordered in ED Medications  acetaminophen (TYLENOL) tablet 650 mg (650 mg Oral Given 09/21/16 0837)  ondansetron (ZOFRAN) injection 4 mg (4 mg Intravenous Given  09/21/16 1003)  sodium chloride 0.9 % bolus 3,000 mL (0 mLs Intravenous Stopped 09/21/16 1227)  iopamidol (ISOVUE-300) 61 % injection (100 mLs  Contrast Given 09/21/16 1026)  acetaminophen (TYLENOL) tablet 650 mg (650 mg Oral Given 09/21/16 1300)     Initial Impression / Assessment and Plan / ED Course  I have reviewed the triage vital signs and the nursing notes.  Pertinent labs & imaging results that were available during my care of the patient were reviewed by me and considered in my medical decision making (see chart for details).      Patient Vitals for the past 24 hrs:  BP Temp Temp src Pulse Resp SpO2  09/21/16 1404 - 99.5 F (37.5 C) - - - -  09/21/16 1345 119/61 - - 92 11 97 %  09/21/16 1330 105/60 - - 93 17 97 %  09/21/16 1315 (!) 112/57 - - 99 18 99 %  09/21/16 1301 120/67 - - (!) 102 (!) 24 100 %  09/21/16 1240 - (!) 101.4 F (38.6 C) Oral - - -  09/21/16 1145 115/75 - - 90 20 100 %    At discharge- reevaluation with update and discussion. After initial assessment and treatment, an updated evaluation reveals the patient is comfortable, and feels better.  Findings discussed with the patient and all questions answered.Mancel Bale. Jin Capote L    Final Clinical Impressions(s) / ED Diagnoses   Final diagnoses:  Febrile illness    Febrile illness, short-term, with nonspecific symptoms, without localizing historical or clinical exam.  Screening evaluation is reassuring.  Doubt intra-abdominal infection, appendicitis, pneumonia, meningitis, serious bacterial infection or metabolic instability.  Nursing Notes Reviewed/ Care Coordinated Applicable Imaging Reviewed Interpretation of Laboratory Data incorporated into ED treatment  The patient appears reasonably screened and/or stabilized for discharge and I doubt any other medical condition or other [facility] requiring further screening, evaluation, or treatment in the ED at this time prior to discharge.  Plan: Home Medications-Tylenol  every 4 hours for fever, continue usual medicines.; Home Treatments-rest, gradually advance diet; return here if the recommended treatment, does not improve the symptoms; Recommended follow up-PCP or return here, as needed  New Prescriptions Discharge Medication List as of 09/21/2016  1:56 PM       Mancel BaleWentz, Kebin Maye, MD 09/22/16 [id number]

## 2016-09-21 NOTE — ED Triage Notes (Signed)
Pt states headache, chest and R arm pain with pain on inspiration (denies cough).  States lower abdominal cramping.  Denies constipation or diarrhea.

## 2018-03-29 ENCOUNTER — Encounter (HOSPITAL_COMMUNITY): Payer: Self-pay | Admitting: Emergency Medicine

## 2018-03-29 ENCOUNTER — Emergency Department (HOSPITAL_COMMUNITY): Payer: PRIVATE HEALTH INSURANCE

## 2018-03-29 ENCOUNTER — Emergency Department (HOSPITAL_COMMUNITY)
Admission: EM | Admit: 2018-03-29 | Discharge: 2018-03-29 | Disposition: A | Discharge status: Home / Self Care | Payer: PRIVATE HEALTH INSURANCE | Attending: Emergency Medicine | Admitting: Emergency Medicine

## 2018-03-29 DIAGNOSIS — Z79899 Other long term (current) drug therapy: Secondary | ICD-10-CM | POA: Diagnosis not present

## 2018-03-29 DIAGNOSIS — R05 Cough: Secondary | ICD-10-CM | POA: Diagnosis present

## 2018-03-29 DIAGNOSIS — R070 Pain in throat: Secondary | ICD-10-CM | POA: Diagnosis not present

## 2018-03-29 DIAGNOSIS — J069 Acute upper respiratory infection, unspecified: Secondary | ICD-10-CM | POA: Insufficient documentation

## 2018-03-29 DIAGNOSIS — Z87891 Personal history of nicotine dependence: Secondary | ICD-10-CM | POA: Insufficient documentation

## 2018-03-29 LAB — GROUP A STREP BY PCR: Group A Strep by PCR: NOT DETECTED

## 2018-03-29 MED ORDER — PSEUDOEPH-BROMPHEN-DM 30-2-10 MG/5ML PO SYRP: 5.0000 mL | ORAL_SOLUTION | Freq: Four times a day (QID) | ORAL | 0 refills | Status: DC | PRN

## 2018-03-29 MED ORDER — PSEUDOEPH-BROMPHEN-DM 30-2-10 MG/5ML PO SYRP: 5.0000 mL | ORAL_SOLUTION | Freq: Four times a day (QID) | ORAL | 0 refills | Status: AC | PRN

## 2018-03-29 NOTE — ED Triage Notes (Signed)
Per pt, states congestion, sore throat and headache for 2 days-no relief with OTC meds

## 2018-03-29 NOTE — ED Provider Notes (Signed)
[facility]-EMERGENCY DEPT Provider Note   CSN: [id number] Arrival date & time: 03/29/18  [id number]     History   Chief Complaint Chief Complaint  Patient presents with  . URI    HPI Rebecca Parks is a 28 y.o. female.  Patient is a 28 y/o F with no PMH who presents to the ED for complaints of URI symptoms for the last 4 days. Patient reports nasal congestion, coughing and sore throat. Denies fever, n/v/d, SOB. Reports flu exposure last weeks. Has been taking dayquil w/o relief. She is concerned for pneumonia since she has had this in the past.      History reviewed. No pertinent past medical history.  There are no active problems to display for this patient.   History reviewed. No pertinent surgical history.   OB History   No obstetric history on file.      Home Medications    Prior to Admission medications   Medication Sig Start Date End Date Taking? Authorizing Provider  brompheniramine-pseudoephedrine-DM 30-2-10 MG/5ML syrup Take 5 mLs by mouth 4 (four) times daily as needed for up to 5 days. 03/29/18 04/03/18  Arlyn DunningMcLean, Maddy Graham A, PA-C  cetirizine (ZYRTEC) 10 MG tablet Take 10 mg by mouth daily.    [provider]  citalopram (CELEXA) 20 MG tablet Take 20 mg by mouth daily.    [provider]  methocarbamol (ROBAXIN) 750 MG tablet Take 1 tablet (750 mg total) by mouth 3 (three) times daily. Patient not taking: Reported on 09/21/2016 07/23/15   Earley FavorSchulz, Gail, NP  predniSONE (DELTASONE) 20 MG tablet 3 Tabs PO Days 1-3, then 2 tabs PO Days 4-6, then 1 tab PO Day 7-9, then Half Tab PO Day 10-12 Patient not taking: Reported on 09/21/2016 07/23/15   Earley FavorSchulz, Gail, NP  Prenatal Vit-Fe Fumarate-FA (MULTIVITAMIN-PRENATAL) 27-0.8 MG TABS tablet Take 1 tablet by mouth daily at 12 noon.    [provider]  pseudoephedrine-guaifenesin (MUCINEX D) 60-600 MG 12 hr tablet Take 1 tablet by mouth every 12 (twelve) hours. Patient not taking:  Reported on 07/23/2015 03/04/15   Joycie Peekartner, Benjamin, PA-C    Family History No family history on file.  Social History Social History   Tobacco Use  . Smoking status: Former Smoker    Packs/day: 0.50    Types: Cigarettes    Last attempt to quit: 09/07/2014    Years since quitting: 3.5  . Smokeless tobacco: Never Used  Substance Use Topics  . Alcohol use: No  . Drug use: No     Allergies   Patient has no known allergies.   Review of Systems Review of Systems  Constitutional: Positive for chills. Negative for activity change, appetite change, diaphoresis, fatigue, fever and unexpected weight change.  HENT: Positive for congestion, rhinorrhea and sore throat. Negative for dental problem, ear pain, sinus pressure, sinus pain, sneezing, tinnitus, trouble swallowing and voice change.   Eyes: Negative for pain and redness.  Respiratory: Positive for cough. Negative for apnea, choking, chest tightness, shortness of breath, wheezing and stridor.   Cardiovascular: Negative for chest pain, palpitations and leg swelling.  Gastrointestinal: Negative for abdominal pain, diarrhea, nausea and vomiting.  Genitourinary: Negative for dysuria and menstrual problem.  Musculoskeletal: Negative for back pain, myalgias and neck pain.  Skin: Negative for rash.  Allergic/Immunologic: Negative for environmental allergies, food allergies and immunocompromised state.  Neurological: Negative for dizziness, light-headedness and headaches.     Physical Exam Updated Vital Signs BP 139/73  Pulse 88   Temp 98.4 F (36.9 C) (Oral)   Resp 16   Ht 5\' 1"  (1.549 m)   Wt 95.3 kg   LMP 03/10/2018   SpO2 99%   BMI 39.68 kg/m   Physical Exam Vitals signs and nursing note reviewed.  Constitutional:      General: She is not in acute distress.    Appearance: Normal appearance. She is obese. She is not ill-appearing, toxic-appearing or diaphoretic.  HENT:     Head: Normocephalic.     Right Ear: Tympanic  membrane and ear canal normal.     Left Ear: Tympanic membrane and ear canal normal.     Nose: Nose normal. No congestion or rhinorrhea.     Mouth/Throat:     Mouth: Mucous membranes are moist.  Eyes:     Conjunctiva/sclera: Conjunctivae normal.     Pupils: Pupils are equal, round, and reactive to light.  Neck:     Musculoskeletal: Normal range of motion and neck supple.  Cardiovascular:     Rate and Rhythm: Normal rate and regular rhythm.     Pulses: Normal pulses.     Heart sounds: Normal heart sounds.  Pulmonary:     Effort: Pulmonary effort is normal. No respiratory distress.     Breath sounds: Normal breath sounds. No wheezing or rales.  Abdominal:     General: Abdomen is flat.  Lymphadenopathy:     Cervical: Cervical adenopathy (bilateral anterior cervical lymph node nlargement, non TTP, soft, mobile) present.  Skin:    General: Skin is warm.     Capillary Refill: Capillary refill takes less than 2 seconds.  Neurological:     General: No focal deficit present.     Mental Status: She is alert.  Psychiatric:        Mood and Affect: Mood normal.      ED Treatments / Results  Labs (all labs ordered are listed, but only abnormal results are displayed) Labs Reviewed  GROUP A STREP BY PCR    EKG None  Radiology Dg Chest 1 View  Result Date: 03/29/2018 CLINICAL DATA:  Cough and congestion with sore throat EXAM: CHEST  1 VIEW COMPARISON:  09/21/2016 FINDINGS: Normal heart size and mediastinal contours. No acute infiltrate or edema. No effusion or pneumothorax. No acute osseous findings. IMPRESSION: Negative chest. Electronically Signed   By: Marnee SpringJonathon  Watts M.D.   On: 03/29/2018 08:46    Procedures Procedures (including critical care time)  Medications Ordered in ED Medications - No data to display   Initial Impression / Assessment and Plan / ED Course  I have reviewed the triage vital signs and the nursing notes.  Pertinent labs & imaging results that were  available during my care of the patient were reviewed by me and considered in my medical decision making (see chart for details).  Clinical Course as of Mar 29 1050  Thu Mar 29, 2018  0830 Patient has URI symptoms. She had negative strep swab. She is afebrile and appears well, symptoms have been going on for about 4 days and therefore she would not qualify for benefit from tamiflu if this was the flu, so no swab was performed for this. He lungs are clear. However, she is concerned for pneumonia due to having such in the past with similar symptoms so a chest xray was ordered.   [KM]  0853 Xray negative. I will treat symptomatically with Bromfed syrup. Patient advised symptoms may persist for one week or longer  due to the nature of URIs. She was advised to f/u with PCP or return if symptoms worsen.    [KM]    Clinical Course User Index [KM] Arlyn Dunning, PA-C      Final Clinical Impressions(s) / ED Diagnoses   Final diagnoses:  Viral upper respiratory tract infection    ED Discharge Orders         Ordered    brompheniramine-pseudoephedrine-DM 30-2-10 MG/5ML syrup  4 times daily PRN,   Status:  Discontinued     03/29/18 0852    brompheniramine-pseudoephedrine-DM 30-2-10 MG/5ML syrup  4 times daily PRN     03/29/18 0859           Arlyn Dunning, PA-C 03/29/18 1052    Gerhard Munch, MD 03/29/18 1555

## 2018-03-29 NOTE — ED Notes (Signed)
Bed: WTR6 Expected date:  Expected time:  Means of arrival:  Comments: 

## 2018-03-29 NOTE — Discharge Instructions (Addendum)
Your xray was normal today and your strep test was negative. It appears you have an upper respiratory infection. Please follow up with your regular doctor if symptoms persist or worsen. Thank you for allowing me to care for you today. Please return to the emergency department if you have new or worsening symptoms. Take your medications as instructed.

## 2019-10-10 ENCOUNTER — Encounter: Payer: Self-pay | Admitting: Sports Medicine

## 2019-10-10 ENCOUNTER — Ambulatory Visit (INDEPENDENT_AMBULATORY_CARE_PROVIDER_SITE_OTHER): Payer: BC Managed Care – PPO | Admitting: Sports Medicine

## 2019-10-10 ENCOUNTER — Other Ambulatory Visit: Payer: Self-pay | Admitting: Sports Medicine

## 2019-10-10 ENCOUNTER — Other Ambulatory Visit: Payer: Self-pay

## 2019-10-10 ENCOUNTER — Ambulatory Visit (INDEPENDENT_AMBULATORY_CARE_PROVIDER_SITE_OTHER): Payer: BC Managed Care – PPO

## 2019-10-10 DIAGNOSIS — S93401A Sprain of unspecified ligament of right ankle, initial encounter: Secondary | ICD-10-CM

## 2019-10-10 DIAGNOSIS — M25371 Other instability, right ankle: Secondary | ICD-10-CM

## 2019-10-10 DIAGNOSIS — M79671 Pain in right foot: Secondary | ICD-10-CM

## 2019-10-10 DIAGNOSIS — IMO0001 Reserved for inherently not codable concepts without codable children: Secondary | ICD-10-CM

## 2019-10-10 MED ORDER — MELOXICAM 15 MG PO TABS: 15.0000 mg | ORAL_TABLET | Freq: Every day | ORAL | 0 refills | Status: DC

## 2019-10-10 NOTE — Progress Notes (Signed)
Subjective:  Rebecca Parks is a 29 y.o. female patient who presents to office for evaluation of right ankle pain. Patient complains of continued pain in the ankle. Patient has tried OTC brace, rest, ice, elevation with no relief in symptoms. Patient denies any other pedal complaints. Admits history of sprain in 2015 and then afterwards ankle always having issues with being weak and most recent sprain injury 2 weeks ago.  Review of systems noncontributory  There are no problems to display for this patient.   Current Outpatient Medications on File Prior to Visit  Medication Sig Dispense Refill  . cetirizine (ZYRTEC) 10 MG tablet Take 10 mg by mouth daily.    . citalopram (CELEXA) 20 MG tablet Take 20 mg by mouth daily.    . clindamycin (CLEOCIN) 300 MG capsule clindamycin HCl 300 mg capsule  Take 1 capsule 3 times a day by oral route for 10 days.    Marland Kitchen etodolac (LODINE) 400 MG tablet etodolac 400 mg tablet  TAKE 1 TABLET BY MOUTH 3 TIMES A DAY    . HYDROcodone-acetaminophen (NORCO/VICODIN) 5-325 MG tablet TAKE 1 TABLET BY MOUTH EVERY 4 TO 6 HOURS AS NEEDED FOR PAIN    . Influenza Virus Vaccine Split SUSP Fluvirin 2014-2015 45 mcg (15 mcg x 3)/0.5 mL intramuscular suspension  ADM 0.5ML UTD    . methocarbamol (ROBAXIN) 750 MG tablet Take 1 tablet (750 mg total) by mouth 3 (three) times daily. 20 tablet 0  . nabumetone (RELAFEN) 750 MG tablet Take by mouth.    . predniSONE (DELTASONE) 20 MG tablet 3 Tabs PO Days 1-3, then 2 tabs PO Days 4-6, then 1 tab PO Day 7-9, then Half Tab PO Day 10-12 20 tablet 0  . Prenatal Vit-Fe Fumarate-FA (MULTIVITAMIN-PRENATAL) 27-0.8 MG TABS tablet Take 1 tablet by mouth daily at 12 noon.    . pseudoephedrine-guaifenesin (MUCINEX D) 60-600 MG 12 hr tablet Take 1 tablet by mouth every 12 (twelve) hours. 15 tablet 0   No current facility-administered medications on file prior to visit.    Allergies  Allergen Reactions  . Tramadol     "I had all the side  effects"    Objective:  General: Alert and oriented x3 in no acute distress  Dermatology: No open lesions bilateral lower extremities, no webspace macerations, no ecchymosis bilateral, all nails x 10 are well manicured.  Vascular: Dorsalis Pedis and Posterior Tibial pedal pulses palpable, Capillary Fill Time 3 seconds,(+) pedal hair growth bilateral, no edema bilateral lower extremities, Temperature gradient within normal limits.  Neurology: Michaell Cowing sensation intact via light touch bilateral.  Musculoskeletal: Mild tenderness with palpation at right ankle at lateral ankle, negative talar tilt, Negative tib-fib stress, mild instability on right ankle.  Range of motion within normal limits with mild guarding on right ankle. Strength within normal limits in all groups bilateral.   Gait: Antalgic gait  Xrays  Right ankle   Impression: No acute osseous findings  Assessment and Plan: Problem List Items Addressed This Visit    None    Visit Diagnoses    Pain in right foot    -  Primary   First degree ankle sprain, right, initial encounter       Ankle instability, right           -Complete examination performed -Xrays reviewed -Discussed treatement options -Dispensed short cam boot for patient to wear during the day as well as surgigrip compression sleeve -Prescribed meloxicam and to take for pain and inflammation -  Recommend rest ice elevation at end of day and advised patient if she cannot wear her cam boot at work to use her brace and tennis shoe while at work -Patient to return to office 2 weeks or sooner if condition worsens.  Advised patient if symptoms persist may benefit from a MRI.  Asencion Islam, DPM

## 2019-10-10 NOTE — Progress Notes (Signed)
DG  °

## 2019-10-31 ENCOUNTER — Ambulatory Visit (INDEPENDENT_AMBULATORY_CARE_PROVIDER_SITE_OTHER): Payer: BC Managed Care – PPO | Admitting: Sports Medicine

## 2019-10-31 ENCOUNTER — Other Ambulatory Visit: Payer: Self-pay

## 2019-10-31 ENCOUNTER — Encounter: Payer: Self-pay | Admitting: Sports Medicine

## 2019-10-31 DIAGNOSIS — M25371 Other instability, right ankle: Secondary | ICD-10-CM

## 2019-10-31 DIAGNOSIS — M79671 Pain in right foot: Secondary | ICD-10-CM | POA: Diagnosis not present

## 2019-10-31 NOTE — Progress Notes (Signed)
Subjective: Rebecca Parks is a 29 y.o. female patient who returns office for follow-up evaluation of right ankle pain.  Patient reports that the boot helps some but the pain is still the same ankle is very weak with pain that radiates from the start of the ankle up the leg.  Patient has been resting icing elevating using compression sleeve and taking meloxicam as instructed.  Patient denies any other pedal complaints at this time.  There are no problems to display for this patient.   Current Outpatient Medications on File Prior to Visit  Medication Sig Dispense Refill  . cetirizine (ZYRTEC) 10 MG tablet Take 10 mg by mouth daily.    . citalopram (CELEXA) 20 MG tablet Take 20 mg by mouth daily.    . clindamycin (CLEOCIN) 300 MG capsule clindamycin HCl 300 mg capsule  Take 1 capsule 3 times a day by oral route for 10 days.    Marland Kitchen etodolac (LODINE) 400 MG tablet etodolac 400 mg tablet  TAKE 1 TABLET BY MOUTH 3 TIMES A DAY    . HYDROcodone-acetaminophen (NORCO/VICODIN) 5-325 MG tablet TAKE 1 TABLET BY MOUTH EVERY 4 TO 6 HOURS AS NEEDED FOR PAIN    . Influenza Virus Vaccine Split SUSP Fluvirin 2014-2015 45 mcg (15 mcg x 3)/0.5 mL intramuscular suspension  ADM 0.5ML UTD    . meloxicam (MOBIC) 15 MG tablet Take 1 tablet (15 mg total) by mouth daily. 30 tablet 0  . methocarbamol (ROBAXIN) 750 MG tablet Take 1 tablet (750 mg total) by mouth 3 (three) times daily. 20 tablet 0  . nabumetone (RELAFEN) 750 MG tablet Take by mouth.    . predniSONE (DELTASONE) 20 MG tablet 3 Tabs PO Days 1-3, then 2 tabs PO Days 4-6, then 1 tab PO Day 7-9, then Half Tab PO Day 10-12 20 tablet 0  . Prenatal Vit-Fe Fumarate-FA (MULTIVITAMIN-PRENATAL) 27-0.8 MG TABS tablet Take 1 tablet by mouth daily at 12 noon.    . pseudoephedrine-guaifenesin (MUCINEX D) 60-600 MG 12 hr tablet Take 1 tablet by mouth every 12 (twelve) hours. 15 tablet 0   No current facility-administered medications on file prior to visit.    Allergies   Allergen Reactions  . Tramadol     "I had all the side effects"    Objective:  General: Alert and oriented x3 in no acute distress  Dermatology: No open lesions bilateral lower extremities, no webspace macerations, no ecchymosis bilateral, all nails x 10 are well manicured.  Vascular: Dorsalis Pedis and Posterior Tibial pedal pulses palpable, Capillary Fill Time 3 seconds,(+) pedal hair growth bilateral, no edema bilateral lower extremities, Temperature gradient within normal limits.  Neurology: Michaell Cowing sensation intact via light touch bilateral.  Musculoskeletal: Mild to moderate tenderness with palpation at right ankle at lateral ankle and to peroneal tendon course, negative talar tilt, Negative tib-fib stress, mild instability on right ankle.  Range of motion within normal limits with mild guarding on right ankle like previous. Strength within normal limits in all groups bilateral.    Assessment and Plan: Problem List Items Addressed This Visit    None    Visit Diagnoses    Pain in right foot    -  Primary   Ankle instability, right       Relevant Orders   MR ANKLE RIGHT WO CONTRAST       -Complete examination performed -Re-Discussed treatement options for continued ankle pain -Ordered MRI for further evaluation to rule out tear right ankle -Continue with  short cam boot as well as surgigrip compression sleeve -Continue with meloxicam and to take for pain and inflammation and Tylenol for any additional breakthrough pain -Patient to return to office after MRI or sooner if problems or issues arise. Asencion Islam, DPM

## 2019-11-01 ENCOUNTER — Other Ambulatory Visit: Payer: Self-pay | Admitting: Sports Medicine

## 2019-11-09 ENCOUNTER — Other Ambulatory Visit: Payer: BC Managed Care – PPO

## 2019-12-01 ENCOUNTER — Ambulatory Visit
Admission: RE | Admit: 2019-12-01 | Discharge: 2019-12-01 | Disposition: A | Discharge status: Home / Self Care | Payer: BC Managed Care – PPO | Source: Ambulatory Visit | Attending: Sports Medicine | Admitting: Sports Medicine

## 2019-12-01 ENCOUNTER — Other Ambulatory Visit: Payer: Self-pay

## 2019-12-01 DIAGNOSIS — M7671 Peroneal tendinitis, right leg: Secondary | ICD-10-CM | POA: Diagnosis not present

## 2019-12-01 DIAGNOSIS — M25371 Other instability, right ankle: Secondary | ICD-10-CM

## 2019-12-01 DIAGNOSIS — S93411A Sprain of calcaneofibular ligament of right ankle, initial encounter: Secondary | ICD-10-CM | POA: Diagnosis not present

## 2019-12-01 DIAGNOSIS — M25471 Effusion, right ankle: Secondary | ICD-10-CM | POA: Diagnosis not present

## 2019-12-01 DIAGNOSIS — M65871 Other synovitis and tenosynovitis, right ankle and foot: Secondary | ICD-10-CM | POA: Diagnosis not present

## 2019-12-02 ENCOUNTER — Telehealth: Payer: Self-pay | Admitting: Sports Medicine

## 2019-12-02 NOTE — Telephone Encounter (Signed)
Ok great thanks! °

## 2019-12-02 NOTE — Telephone Encounter (Signed)
FYI-I contacted pt & scheduled f/u for MRI results but she was unable to come this week & requested to schedule out approx. 2 weeks due to work.

## 2019-12-13 ENCOUNTER — Ambulatory Visit: Payer: BC Managed Care – PPO | Admitting: Sports Medicine

## 2019-12-19 ENCOUNTER — Ambulatory Visit (INDEPENDENT_AMBULATORY_CARE_PROVIDER_SITE_OTHER): Payer: BC Managed Care – PPO | Admitting: Sports Medicine

## 2019-12-19 ENCOUNTER — Other Ambulatory Visit: Payer: Self-pay

## 2019-12-19 ENCOUNTER — Encounter: Payer: Self-pay | Admitting: Sports Medicine

## 2019-12-19 DIAGNOSIS — T148XXA Other injury of unspecified body region, initial encounter: Secondary | ICD-10-CM

## 2019-12-19 DIAGNOSIS — M659 Synovitis and tenosynovitis, unspecified: Secondary | ICD-10-CM

## 2019-12-19 DIAGNOSIS — M25571 Pain in right ankle and joints of right foot: Secondary | ICD-10-CM

## 2019-12-19 DIAGNOSIS — M25371 Other instability, right ankle: Secondary | ICD-10-CM

## 2019-12-19 DIAGNOSIS — M79671 Pain in right foot: Secondary | ICD-10-CM | POA: Diagnosis not present

## 2019-12-19 MED ORDER — MELOXICAM 15 MG PO TABS: 15.0000 mg | ORAL_TABLET | Freq: Every day | ORAL | 0 refills | Status: DC

## 2019-12-19 NOTE — Progress Notes (Signed)
Subjective: Rebecca Parks is a 29 y.o. female patient who returns office for follow-up evaluation of right ankle pain and for review of MRI results.  Patient reports that the boot helps some but the pain is still the same in the ankle. States that Mobic helped some.  Patient denies any other pedal complaints at this time.  There are no problems to display for this patient.   Current Outpatient Medications on File Prior to Visit  Medication Sig Dispense Refill  . cetirizine (ZYRTEC) 10 MG tablet Take 10 mg by mouth daily.    . citalopram (CELEXA) 20 MG tablet Take 20 mg by mouth daily.    . clindamycin (CLEOCIN) 300 MG capsule clindamycin HCl 300 mg capsule  Take 1 capsule 3 times a day by oral route for 10 days.    Marland Kitchen etodolac (LODINE) 400 MG tablet etodolac 400 mg tablet  TAKE 1 TABLET BY MOUTH 3 TIMES A DAY    . HYDROcodone-acetaminophen (NORCO/VICODIN) 5-325 MG tablet TAKE 1 TABLET BY MOUTH EVERY 4 TO 6 HOURS AS NEEDED FOR PAIN    . Influenza Virus Vaccine Split SUSP Fluvirin 2014-2015 45 mcg (15 mcg x 3)/0.5 mL intramuscular suspension  ADM 0.5ML UTD    . meloxicam (MOBIC) 15 MG tablet Take 1 tablet (15 mg total) by mouth daily. 30 tablet 0  . methocarbamol (ROBAXIN) 750 MG tablet Take 1 tablet (750 mg total) by mouth 3 (three) times daily. 20 tablet 0  . nabumetone (RELAFEN) 750 MG tablet Take by mouth.    . predniSONE (DELTASONE) 20 MG tablet 3 Tabs PO Days 1-3, then 2 tabs PO Days 4-6, then 1 tab PO Day 7-9, then Half Tab PO Day 10-12 20 tablet 0  . Prenatal Vit-Fe Fumarate-FA (MULTIVITAMIN-PRENATAL) 27-0.8 MG TABS tablet Take 1 tablet by mouth daily at 12 noon.    . pseudoephedrine-guaifenesin (MUCINEX D) 60-600 MG 12 hr tablet Take 1 tablet by mouth every 12 (twelve) hours. 15 tablet 0   No current facility-administered medications on file prior to visit.    Allergies  Allergen Reactions  . Tramadol     "I had all the side effects"   No family history on file.  No  past surgical history on file.  Social History   Socioeconomic History  . Marital status: Single    Spouse name: Not on file  . Number of children: Not on file  . Years of education: Not on file  . Highest education level: Not on file  Occupational History  . Not on file  Tobacco Use  . Smoking status: Former Smoker    Packs/day: 0.50    Types: Cigarettes    Quit date: 09/07/2014    Years since quitting: 5.2  . Smokeless tobacco: Never Used  Vaping Use  . Vaping Use: Never used  Substance and Sexual Activity  . Alcohol use: No  . Drug use: No  . Sexual activity: Not on file  Other Topics Concern  . Not on file  Social History Narrative  . Not on file   Social Determinants of Health   Financial Resource Strain:   . Difficulty of Paying Living Expenses: Not on file  Food Insecurity:   . Worried About Programme researcher, broadcasting/film/video in the Last Year: Not on file  . Ran Out of Food in the Last Year: Not on file  Transportation Needs:   . Lack of Transportation (Medical): Not on file  . Lack of Transportation (Non-Medical): Not  on file  Physical Activity:   . Days of Exercise per Week: Not on file  . Minutes of Exercise per Session: Not on file  Stress:   . Feeling of Stress : Not on file  Social Connections:   . Frequency of Communication with Friends and Family: Not on file  . Frequency of Social Gatherings with Friends and Family: Not on file  . Attends Religious Services: Not on file  . Active Member of Clubs or Organizations: Not on file  . Attends Banker Meetings: Not on file  . Marital Status: Not on file  Intimate Partner Violence:   . Fear of Current or Ex-Partner: Not on file  . Emotionally Abused: Not on file  . Physically Abused: Not on file  . Sexually Abused: Not on file    Objective:  General: Alert and oriented x3 in no acute distress  Dermatology: No open lesions bilateral lower extremities, no webspace macerations, no ecchymosis bilateral,  all nails x 10 are well manicured.  Vascular: Dorsalis Pedis and Posterior Tibial pedal pulses palpable, Capillary Fill Time 3 seconds,(+) pedal hair growth bilateral, no edema bilateral lower extremities, Temperature gradient within normal limits.  Neurology: Michaell Cowing sensation intact via light touch bilateral.  Musculoskeletal: Mild to moderate tenderness with palpation at right ankle at lateral ankle and to peroneal tendon course, negative talar tilt, Negative tib-fib stress, mild instability on right ankle like previous.  Range of motion within normal limits with mild guarding on right ankle like previous. Strength within normal limits in all groups bilateral.   MRI 11/2019 1. 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 The calcaneofibular ligament is injured but not completely torn. 2. Moderate tenosynovitis involving the peroneal tendons. No tendon tear/rupture. 3. Mild tenosynovitis involving the posterior tibialis tendon. 4. Small ankle and subtalar joint effusions could be posttraumatic synovitis. No cartilage defects or osteochondral lesions. 5. No acute bony findings.   Assessment and Plan: Problem List Items Addressed This Visit    None    Visit Diagnoses    Ankle instability, right    -  Primary   Pain in right foot       Ligament tear       Synovitis and tenosynovitis, unspecified       Right ankle pain, unspecified chronicity           -Complete examination performed -Re-Discussed treatement options for continued ankle pain with tear and ongoing pain -Patient opt for surgical management. Consent obtained for Right lateral ankle ligament repair with splint and steroid injection at ankle. Pre and Post op course explained. Risks, benefits, alternatives explained. No guarantees given or implied. Surgical booking slip submitted and provided patient with Surgical packet and info for [facility] -To dispense crutches at [facility] -Continue with short cam boot as well as surgigrip  compression sleeve until time for surgery -Continue with meloxicam and to take for pain and inflammation and Tylenol for any additional breakthrough pain like previous -Patient to return to office after surgery or sooner if problems or issues arise. Asencion Islam, DPM

## 2020-01-07 ENCOUNTER — Encounter: Payer: Self-pay | Admitting: Sports Medicine

## 2020-01-22 ENCOUNTER — Other Ambulatory Visit: Payer: Self-pay | Admitting: Sports Medicine

## 2020-01-22 MED ORDER — MELOXICAM 15 MG PO TABS: 15.0000 mg | ORAL_TABLET | Freq: Every day | ORAL | 0 refills | Status: DC

## 2020-01-22 NOTE — Telephone Encounter (Signed)
Please advise 

## 2020-02-14 DIAGNOSIS — Z3141 Encounter for fertility testing: Secondary | ICD-10-CM | POA: Diagnosis not present

## 2020-02-14 DIAGNOSIS — Z6838 Body mass index (BMI) 38.0-38.9, adult: Secondary | ICD-10-CM | POA: Diagnosis not present

## 2020-02-14 DIAGNOSIS — Z124 Encounter for screening for malignant neoplasm of cervix: Secondary | ICD-10-CM | POA: Diagnosis not present

## 2020-02-14 DIAGNOSIS — Z01419 Encounter for gynecological examination (general) (routine) without abnormal findings: Secondary | ICD-10-CM | POA: Diagnosis not present

## 2020-02-23 ENCOUNTER — Other Ambulatory Visit: Payer: Self-pay | Admitting: Sports Medicine

## 2020-02-24 MED ORDER — MELOXICAM 15 MG PO TABS: 15.0000 mg | ORAL_TABLET | Freq: Every day | ORAL | 0 refills | Status: DC

## 2020-03-21 ENCOUNTER — Other Ambulatory Visit: Payer: Self-pay | Admitting: Sports Medicine

## 2020-03-21 MED ORDER — MELOXICAM 15 MG PO TABS: 15.0000 mg | ORAL_TABLET | Freq: Every day | ORAL | 0 refills | Status: DC

## 2020-04-27 ENCOUNTER — Other Ambulatory Visit: Payer: Self-pay | Admitting: Sports Medicine

## 2020-04-27 NOTE — Telephone Encounter (Signed)
Please advise 

## 2020-04-28 ENCOUNTER — Other Ambulatory Visit: Payer: Self-pay | Admitting: Sports Medicine

## 2020-04-28 MED ORDER — MELOXICAM 15 MG PO TABS: 15.0000 mg | ORAL_TABLET | Freq: Every day | ORAL | 0 refills | Status: DC

## 2020-04-29 NOTE — Telephone Encounter (Signed)
Please advise 

## 2020-06-10 ENCOUNTER — Other Ambulatory Visit: Payer: Self-pay | Admitting: Sports Medicine

## 2020-06-10 NOTE — Telephone Encounter (Signed)
Please advise 

## 2020-06-11 MED ORDER — MELOXICAM 15 MG PO TABS: 15.0000 mg | ORAL_TABLET | Freq: Every day | ORAL | 0 refills | Status: DC

## 2020-08-06 DIAGNOSIS — R0981 Nasal congestion: Secondary | ICD-10-CM | POA: Diagnosis not present

## 2020-08-06 DIAGNOSIS — R42 Dizziness and giddiness: Secondary | ICD-10-CM | POA: Diagnosis not present

## 2020-10-15 ENCOUNTER — Other Ambulatory Visit: Payer: Self-pay | Admitting: Sports Medicine

## 2020-10-15 NOTE — Telephone Encounter (Signed)
Please advise 

## 2021-04-20 DIAGNOSIS — G441 Vascular headache, not elsewhere classified: Secondary | ICD-10-CM | POA: Diagnosis not present

## 2021-04-20 DIAGNOSIS — J029 Acute pharyngitis, unspecified: Secondary | ICD-10-CM | POA: Diagnosis not present

## 2021-09-27 DIAGNOSIS — Z01419 Encounter for gynecological examination (general) (routine) without abnormal findings: Secondary | ICD-10-CM | POA: Diagnosis not present

## 2021-11-04 ENCOUNTER — Ambulatory Visit (INDEPENDENT_AMBULATORY_CARE_PROVIDER_SITE_OTHER): Payer: BC Managed Care – PPO | Admitting: Emergency Medicine

## 2021-11-04 ENCOUNTER — Encounter: Payer: Self-pay | Admitting: Emergency Medicine

## 2021-11-04 VITALS — BP 110/72 | HR 73 | Temp 98.2°F | Ht 61.0 in | Wt 214.2 lb

## 2021-11-04 DIAGNOSIS — G43C1 Periodic headache syndromes in child or adult, intractable: Secondary | ICD-10-CM | POA: Diagnosis not present

## 2021-11-04 DIAGNOSIS — Z7689 Persons encountering health services in other specified circumstances: Secondary | ICD-10-CM

## 2021-11-04 MED ORDER — SUMATRIPTAN SUCCINATE 50 MG PO TABS: ORAL_TABLET | ORAL | 3 refills | Status: AC

## 2021-11-04 NOTE — Patient Instructions (Signed)
Migraine Headache ?A migraine headache is a very strong throbbing pain on one side or both sides of your head. This type of headache can also cause other symptoms. It can last from 4 hours to 3 days. Talk with your doctor about what things may bring on (trigger) this condition. ?What are the causes? ?The exact cause of this condition is not known. This condition may be triggered or caused by: ?Drinking alcohol. ?Smoking. ?Taking medicines, such as: ?Medicine used to treat chest pain (nitroglycerin). ?Birth control pills. ?Estrogen. ?Some blood pressure medicines. ?Eating or drinking certain products. ?Doing physical activity. ?Other things that may trigger a migraine headache include: ?Having a menstrual period. ?Pregnancy. ?Hunger. ?Stress. ?Not getting enough sleep or getting too much sleep. ?Weather changes. ?Tiredness (fatigue). ?What increases the risk? ?Being 25-55 years old. ?Being female. ?Having a family history of migraine headaches. ?Being Caucasian. ?Having depression or anxiety. ?Being very overweight. ?What are the signs or symptoms? ?A throbbing pain. This pain may: ?Happen in any area of the head, such as on one side or both sides. ?Make it hard to do daily activities. ?Get worse with physical activity. ?Get worse around bright lights or loud noises. ?Other symptoms may include: ?Feeling sick to your stomach (nauseous). ?Vomiting. ?Dizziness. ?Being sensitive to bright lights, loud noises, or smells. ?Before you get a migraine headache, you may get warning signs (an aura). An aura may include: ?Seeing flashing lights or having blind spots. ?Seeing bright spots, halos, or zigzag lines. ?Having tunnel vision or blurred vision. ?Having numbness or a tingling feeling. ?Having trouble talking. ?Having weak muscles. ?Some people have symptoms after a migraine headache (postdromal phase), such as: ?Tiredness. ?Trouble thinking (concentrating). ?How is this treated? ?Taking medicines that: ?Relieve  pain. ?Relieve the feeling of being sick to your stomach. ?Prevent migraine headaches. ?Treatment may also include: ?Having acupuncture. ?Avoiding foods that bring on migraine headaches. ?Learning ways to control your body functions (biofeedback). ?Therapy to help you know and deal with negative thoughts (cognitive behavioral therapy). ?Follow these instructions at home: ?Medicines ?Take over-the-counter and prescription medicines only as told by your doctor. ?Ask your doctor if the medicine prescribed to you: ?Requires you to avoid driving or using heavy machinery. ?Can cause trouble pooping (constipation). You may need to take these steps to prevent or treat trouble pooping: ?Drink enough fluid to keep your pee (urine) pale yellow. ?Take over-the-counter or prescription medicines. ?Eat foods that are high in fiber. These include beans, whole grains, and fresh fruits and vegetables. ?Limit foods that are high in fat and sugar. These include fried or sweet foods. ?Lifestyle ?Do not drink alcohol. ?Do not use any products that contain nicotine or tobacco, such as cigarettes, e-cigarettes, and chewing tobacco. If you need help quitting, ask your doctor. ?Get at least 8 hours of sleep every night. ?Limit and deal with stress. ?General instructions ? ?  ? ?Keep a journal to find out what may bring on your migraine headaches. For example, write down: ?What you eat and drink. ?How much sleep you get. ?Any change in what you eat or drink. ?Any change in your medicines. ?If you have a migraine headache: ?Avoid things that make your symptoms worse, such as bright lights. ?It may help to lie down in a dark, quiet room. ?Do not drive or use heavy machinery. ?Ask your doctor what activities are safe for you. ?Keep all follow-up visits as told by your doctor. This is important. ?Contact a doctor if: ?You get a migraine   headache that is different or worse than others you have had. ?You have more than 15 headache days in one  month. ?Get help right away if: ?Your migraine headache gets very bad. ?Your migraine headache lasts longer than 72 hours. ?You have a fever. ?You have a stiff neck. ?You have trouble seeing. ?Your muscles feel weak or like you cannot control them. ?You start to lose your balance a lot. ?You start to have trouble walking. ?You pass out (faint). ?You have a seizure. ?Summary ?A migraine headache is a very strong throbbing pain on one side or both sides of your head. These headaches can also cause other symptoms. ?This condition may be treated with medicines and changes to your lifestyle. ?Keep a journal to find out what may bring on your migraine headaches. ?Contact a doctor if you get a migraine headache that is different or worse than others you have had. ?Contact your doctor if you have more than 15 headache days in a month. ?This information is not intended to replace advice given to you by your health care provider. Make sure you discuss any questions you have with your health care provider. ?Document Revised: 06/08/2018 Document Reviewed: 03/29/2018 ?Elsevier Patient Education ? 2023 Elsevier Inc. ? ?

## 2021-11-04 NOTE — Progress Notes (Signed)
Rebecca Parks 30 y.o.   Chief Complaint  Patient presents with   New Patient (Initial Visit)   Headache    Migraines     HISTORY OF PRESENT ILLNESS: This is a 31 y.o. female first visit to this office, here to establish care with me. Has a history of migraine headaches Non-smoker.  On Celexa as prescribed by her gynecologist. No other chronic medical problems.  No other chronic medications.  HPI   Prior to Admission medications   Medication Sig Start Date End Date Taking? Authorizing Provider  cetirizine (ZYRTEC) 10 MG tablet Take 10 mg by mouth daily.   Yes [provider]  citalopram (CELEXA) 20 MG tablet Take 40 mg by mouth daily.   Yes [provider]  loratadine (CLARITIN) 10 MG tablet Take 10 mg by mouth daily.   Yes [provider]  omeprazole (PRILOSEC) 20 MG capsule Take 20 mg by mouth daily.   Yes [provider]  clindamycin (CLEOCIN) 300 MG capsule clindamycin HCl 300 mg capsule  Take 1 capsule 3 times a day by oral route for 10 days. Patient not taking: Reported on 11/04/2021    [provider]  etodolac (LODINE) 400 MG tablet etodolac 400 mg tablet  TAKE 1 TABLET BY MOUTH 3 TIMES A DAY Patient not taking: Reported on 11/04/2021    [provider]  HYDROcodone-acetaminophen (NORCO/VICODIN) 5-325 MG tablet TAKE 1 TABLET BY MOUTH EVERY 4 TO 6 HOURS AS NEEDED FOR PAIN Patient not taking: Reported on 11/04/2021 04/29/19   [provider]  Influenza Virus Vaccine Split SUSP Fluvirin 2014-2015 45 mcg (15 mcg x 3)/0.5 mL intramuscular suspension  ADM 0.5ML UTD Patient not taking: Reported on 11/04/2021    [provider]  meloxicam (MOBIC) 15 MG tablet Take 1 tablet by mouth once daily Patient not taking: Reported on 11/04/2021 10/15/20   Asencion Islam, DPM  methocarbamol (ROBAXIN) 750 MG tablet Take 1 tablet (750 mg total) by mouth 3 (three) times daily. Patient not taking: Reported on 11/04/2021 07/23/15    Earley Favor, NP  nabumetone (RELAFEN) 750 MG tablet Take by mouth. Patient not taking: Reported on 11/04/2021 09/12/17   [provider]  predniSONE (DELTASONE) 20 MG tablet 3 Tabs PO Days 1-3, then 2 tabs PO Days 4-6, then 1 tab PO Day 7-9, then Half Tab PO Day 10-12 Patient not taking: Reported on 11/04/2021 07/23/15   Earley Favor, NP  Prenatal Vit-Fe Fumarate-FA (MULTIVITAMIN-PRENATAL) 27-0.8 MG TABS tablet Take 1 tablet by mouth daily at 12 noon. Patient not taking: Reported on 11/04/2021    [provider]  pseudoephedrine-guaifenesin (MUCINEX D) 60-600 MG 12 hr tablet Take 1 tablet by mouth every 12 (twelve) hours. Patient not taking: Reported on 11/04/2021 03/04/15   Joycie Peek, PA-C    Allergies  Allergen Reactions   Tramadol     "I had all the side effects"   Latex     There are no problems to display for this patient.   History reviewed. No pertinent past medical history.  History reviewed. No pertinent surgical history.  Social History   Socioeconomic History   Marital status: Married    Spouse name: Not on file   Number of children: Not on file   Years of education: Not on file   Highest education level: Not on file  Occupational History   Not on file  Tobacco Use   Smoking status: Former    Packs/day: 0.50    Types:  Cigarettes    Quit date: 09/07/2014    Years since quitting: 7.1   Smokeless tobacco: Never  Vaping Use   Vaping Use: Never used  Substance and Sexual Activity   Alcohol use: No   Drug use: No   Sexual activity: Not on file  Other Topics Concern   Not on file  Social History Narrative   Not on file   Social Determinants of Health   Financial Resource Strain: Not on file  Food Insecurity: Not on file  Transportation Needs: Not on file  Physical Activity: Not on file  Stress: Not on file  Social Connections: Not on file  Intimate Partner Violence: Not on file    History reviewed. No pertinent family  history.   Review of Systems  Constitutional: Negative.  Negative for chills and fever.  HENT: Negative.    Eyes: Negative.   Respiratory: Negative.    Cardiovascular: Negative.   Gastrointestinal: Negative.   Genitourinary: Negative.   Musculoskeletal: Negative.   Skin: Negative.  Negative for rash.  Neurological:  Positive for headaches.  All other systems reviewed and are negative.  Today's Vitals   11/04/21 1406  BP: 110/72  Pulse: 73  Temp: 98.2 F (36.8 C)  TempSrc: Oral  SpO2: 97%  Weight: 214 lb 4 oz (97.2 kg)  Height: 5\' 1"  (1.549 m)   Body mass index is 40.48 kg/m.   Physical Exam Vitals reviewed.  Constitutional:      Appearance: She is well-developed. She is obese.  HENT:     Head: Normocephalic.  Eyes:     Extraocular Movements: Extraocular movements intact.     Pupils: Pupils are equal, round, and reactive to light.  Cardiovascular:     Rate and Rhythm: Normal rate and regular rhythm.  Pulmonary:     Effort: Pulmonary effort is normal.     Breath sounds: Normal breath sounds.  Musculoskeletal:        General: Normal range of motion.     Cervical back: Neck supple.  Lymphadenopathy:     Cervical: No cervical adenopathy.  Skin:    General: Skin is warm and dry.     Capillary Refill: Capillary refill takes less than 2 seconds.  Neurological:     General: No focal deficit present.     Mental Status: She is alert and oriented to person, place, and time.  Psychiatric:        Mood and Affect: Mood normal.        Behavior: Behavior normal.      ASSESSMENT & PLAN: A total of 49 minutes was spent with the patient and counseling/coordination of care regarding preparing for this visit, review of available medical records, review of past medical history, establishing care with me, comprehensive history and physical examination, diagnosis of migraine headaches and management, prognosis, education on nutrition, documentation and need for  follow-up.  Problem List Items Addressed This Visit       Cardiovascular and Mediastinum   Intractable periodic headache syndrome - Primary    History of migraines.  Frequent and affecting quality of life. Has not tried triptans in the past. Imitrex 50 mg at the onset of headache recommended.      Relevant Medications   SUMAtriptan (IMITREX) 50 MG tablet   Other Visit Diagnoses     Encounter to establish care          Patient Instructions  Migraine Headache A migraine headache is a very strong throbbing pain on one  side or both sides of your head. This type of headache can also cause other symptoms. It can last from 4 hours to 3 days. Talk with your doctor about what things may bring on (trigger) this condition. What are the causes? The exact cause of this condition is not known. This condition may be triggered or caused by: Drinking alcohol. Smoking. Taking medicines, such as: Medicine used to treat chest pain (nitroglycerin). Birth control pills. Estrogen. Some blood pressure medicines. Eating or drinking certain products. Doing physical activity. Other things that may trigger a migraine headache include: Having a menstrual period. Pregnancy. Hunger. Stress. Not getting enough sleep or getting too much sleep. Weather changes. Tiredness (fatigue). What increases the risk? Being 39-48 years old. Being female. Having a family history of migraine headaches. Being Caucasian. Having depression or anxiety. Being very overweight. What are the signs or symptoms? A throbbing pain. This pain may: Happen in any area of the head, such as on one side or both sides. Make it hard to do daily activities. Get worse with physical activity. Get worse around bright lights or loud noises. Other symptoms may include: Feeling sick to your stomach (nauseous). Vomiting. Dizziness. Being sensitive to bright lights, loud noises, or smells. Before you get a migraine headache, you  may get warning signs (an aura). An aura may include: Seeing flashing lights or having blind spots. Seeing bright spots, halos, or zigzag lines. Having tunnel vision or blurred vision. Having numbness or a tingling feeling. Having trouble talking. Having weak muscles. Some people have symptoms after a migraine headache (postdromal phase), such as: Tiredness. Trouble thinking (concentrating). How is this treated? Taking medicines that: Relieve pain. Relieve the feeling of being sick to your stomach. Prevent migraine headaches. Treatment may also include: Having acupuncture. Avoiding foods that bring on migraine headaches. Learning ways to control your body functions (biofeedback). Therapy to help you know and deal with negative thoughts (cognitive behavioral therapy). Follow these instructions at home: Medicines Take over-the-counter and prescription medicines only as told by your doctor. Ask your doctor if the medicine prescribed to you: Requires you to avoid driving or using heavy machinery. Can cause trouble pooping (constipation). You may need to take these steps to prevent or treat trouble pooping: Drink enough fluid to keep your pee (urine) pale yellow. Take over-the-counter or prescription medicines. Eat foods that are high in fiber. These include beans, whole grains, and fresh fruits and vegetables. Limit foods that are high in fat and sugar. These include fried or sweet foods. Lifestyle Do not drink alcohol. Do not use any products that contain nicotine or tobacco, such as cigarettes, e-cigarettes, and chewing tobacco. If you need help quitting, ask your doctor. Get at least 8 hours of sleep every night. Limit and deal with stress. General instructions     Keep a journal to find out what may bring on your migraine headaches. For example, write down: What you eat and drink. How much sleep you get. Any change in what you eat or drink. Any change in your medicines. If  you have a migraine headache: Avoid things that make your symptoms worse, such as bright lights. It may help to lie down in a dark, quiet room. Do not drive or use heavy machinery. Ask your doctor what activities are safe for you. Keep all follow-up visits as told by your doctor. This is important. Contact a doctor if: You get a migraine headache that is different or worse than others you have had. You  have more than 15 headache days in one month. Get help right away if: Your migraine headache gets very bad. Your migraine headache lasts longer than 72 hours. You have a fever. You have a stiff neck. You have trouble seeing. Your muscles feel weak or like you cannot control them. You start to lose your balance a lot. You start to have trouble walking. You pass out (faint). You have a seizure. Summary A migraine headache is a very strong throbbing pain on one side or both sides of your head. These headaches can also cause other symptoms. This condition may be treated with medicines and changes to your lifestyle. Keep a journal to find out what may bring on your migraine headaches. Contact a doctor if you get a migraine headache that is different or worse than others you have had. Contact your doctor if you have more than 15 headache days in a month. This information is not intended to replace advice given to you by your health care provider. Make sure you discuss any questions you have with your health care provider. Document Revised: 06/08/2018 Document Reviewed: 03/29/2018 Elsevier Patient Education  2023 Elsevier Inc.      Edwina Barth, MD [facility]

## 2021-11-05 DIAGNOSIS — G43C1 Periodic headache syndromes in child or adult, intractable: Secondary | ICD-10-CM | POA: Insufficient documentation

## 2021-11-05 NOTE — Assessment & Plan Note (Signed)
History of migraines.  Frequent and affecting quality of life. Has not tried triptans in the past. Imitrex 50 mg at the onset of headache recommended.

## 2022-07-26 ENCOUNTER — Other Ambulatory Visit: Payer: Self-pay

## 2022-07-26 ENCOUNTER — Encounter (HOSPITAL_BASED_OUTPATIENT_CLINIC_OR_DEPARTMENT_OTHER): Payer: Self-pay | Admitting: Emergency Medicine

## 2022-07-26 DIAGNOSIS — Z91040 Latex allergy status: Secondary | ICD-10-CM | POA: Diagnosis not present

## 2022-07-26 DIAGNOSIS — R519 Headache, unspecified: Secondary | ICD-10-CM | POA: Diagnosis not present

## 2022-07-26 DIAGNOSIS — G43809 Other migraine, not intractable, without status migrainosus: Secondary | ICD-10-CM | POA: Insufficient documentation

## 2022-07-26 NOTE — ED Triage Notes (Signed)
Migraine since 3 AM yesterday. Frontal. Nausea,  Unrelieved with otc or sumatriptan

## 2022-07-27 ENCOUNTER — Emergency Department (HOSPITAL_BASED_OUTPATIENT_CLINIC_OR_DEPARTMENT_OTHER)
Admission: EM | Admit: 2022-07-27 | Discharge: 2022-07-27 | Disposition: A | Discharge status: Home / Self Care | Payer: BC Managed Care – PPO | Attending: Emergency Medicine | Admitting: Emergency Medicine

## 2022-07-27 DIAGNOSIS — G43809 Other migraine, not intractable, without status migrainosus: Secondary | ICD-10-CM

## 2022-07-27 MED ORDER — PROCHLORPERAZINE EDISYLATE 10 MG/2ML IJ SOLN
10.0000 mg | Freq: Once | INTRAMUSCULAR | Status: AC
  Administered 2022-07-27: 10 mg via INTRAVENOUS
  Filled 2022-07-27: qty 2

## 2022-07-27 MED ORDER — KETOROLAC TROMETHAMINE 30 MG/ML IJ SOLN
30.0000 mg | Freq: Once | INTRAMUSCULAR | Status: AC
  Administered 2022-07-27: 30 mg via INTRAMUSCULAR
  Filled 2022-07-27: qty 1

## 2022-07-27 MED ORDER — DIPHENHYDRAMINE HCL 50 MG/ML IJ SOLN
25.0000 mg | Freq: Once | INTRAMUSCULAR | Status: AC
  Administered 2022-07-27: 25 mg via INTRAVENOUS
  Filled 2022-07-27: qty 1

## 2022-07-27 NOTE — ED Provider Notes (Signed)
[place] EMERGENCY DEPARTMENT AT [facility]  Provider Note  CSN: [id number] Arrival date & time: 07/26/22 2333  History Chief Complaint  Patient presents with   Migraine    Rebecca Parks is a 32 y.o. female with history of frequent migraines, multiple per week, reports left sided headache for about 24 hours, not relieved with usual meds including sumatriptan. Symptoms are associated with nausea and photophobia.    Home Medications Prior to Admission medications   Medication Sig Start Date End Date Taking? Authorizing Provider  cetirizine (ZYRTEC) 10 MG tablet Take 10 mg by mouth daily.    [provider]  citalopram (CELEXA) 20 MG tablet Take 40 mg by mouth daily.    [provider]  loratadine (CLARITIN) 10 MG tablet Take 10 mg by mouth daily.    [provider]  omeprazole (PRILOSEC) 20 MG capsule Take 20 mg by mouth daily.    [provider]  SUMAtriptan (IMITREX) 50 MG tablet Take 50 mg at the onset of headache; may repeat every 2 hours but no more than 200 mg/24 hours. 11/04/21   Georgina Quint, MD     Allergies    Tramadol and Latex   Review of Systems   Review of Systems Please see HPI for pertinent positives and negatives  Physical Exam BP 131/88 (BP Location: Right Arm)   Pulse (!) 53   Temp 98.1 F (36.7 C) (Oral)   Resp 16   LMP 07/12/2022   SpO2 100%   Physical Exam Vitals and nursing note reviewed.  Constitutional:      Appearance: Normal appearance.  HENT:     Head: Normocephalic and atraumatic.     Nose: Nose normal.     Mouth/Throat:     Mouth: Mucous membranes are moist.  Eyes:     Extraocular Movements: Extraocular movements intact.     Conjunctiva/sclera: Conjunctivae normal.  Cardiovascular:     Rate and Rhythm: Normal rate.  Pulmonary:     Effort: Pulmonary effort is normal.     Breath sounds: Normal breath sounds.  Abdominal:     General: Abdomen is flat.     Palpations:  Abdomen is soft.     Tenderness: There is no abdominal tenderness.  Musculoskeletal:        General: No swelling. Normal range of motion.     Cervical back: Neck supple.  Skin:    General: Skin is warm and dry.  Neurological:     General: No focal deficit present.     Mental Status: She is alert and oriented to person, place, and time.     Cranial Nerves: No cranial nerve deficit.     Sensory: No sensory deficit.     Motor: No weakness.  Psychiatric:        Mood and Affect: Mood normal.     ED Results / Procedures / Treatments   EKG None  Procedures Procedures  Medications Ordered in the ED Medications  ketorolac (TORADOL) 30 MG/ML injection 30 mg (30 mg Intramuscular Given 07/27/22 0215)  prochlorperazine (COMPAZINE) injection 10 mg (10 mg Intravenous Given 07/27/22 0214)  diphenhydrAMINE (BENADRYL) injection 25 mg (25 mg Intravenous Given 07/27/22 0214)    Initial Impression and Plan  Patient here with her typical migraine. Not relieved with home meds. Will give headache cocktail and reassess.   ED Course   Clinical Course as of 07/27/22 0303  Wed Jul 27, 2022  0302 Patient reports migraine is improved  but not fully resolved, however she is comfortable managing her symptoms at home and would like to go sleep in her own bed. She has not been evaluated by neurology in the past, PCP has been managing her symptoms. Given frequency, she may benefit from Neuro evaluation, referral given. RTED for any other concerns.  [CS]    Clinical Course User Index [CS] Pollyann Savoy, MD     MDM Rules/Calculators/A&P Medical Decision Making Problems Addressed: Other migraine without status migrainosus, not intractable: chronic illness or injury with exacerbation, progression, or side effects of treatment  Risk Prescription drug management.     Final Clinical Impression(s) / ED Diagnoses Final diagnoses:  Other migraine without status migrainosus, not intractable    Rx /  DC Orders ED Discharge Orders     None        Pollyann Savoy, MD 07/27/22 [phone]

## 2022-07-30 IMAGING — MR MR ANKLE*R* W/O CM
5 series · 37 of 40 positions shown · non-contrast
Comparison: Radiographs 10/10/2019

CLINICAL DATA: Right ankle sprain.

EXAM:
MRI OF THE RIGHT ANKLE WITHOUT CONTRAST
TECHNIQUE: Multiplanar, multisequence MR imaging of the ankle was performed. No
intravenous contrast was administered.

[Series 3: T2 fat-sat · axial · 3.0mm · 0.66mm/px · z∈[-100,+40]mm · 8 of 40 slices shown (1 of 2)]
[im 1/40]
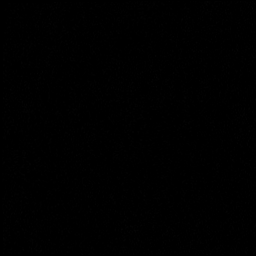
[im 5/40]
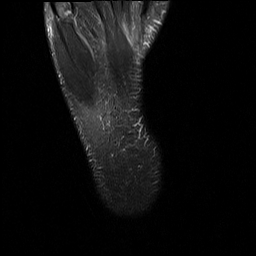
[im 14/40]
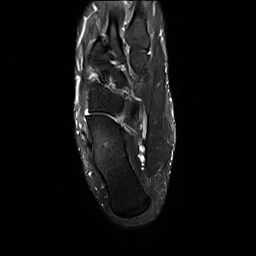
[im 18/40]
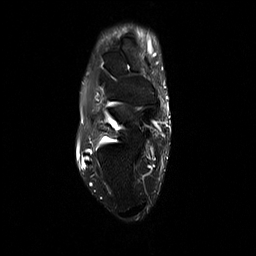
[im 22/40]
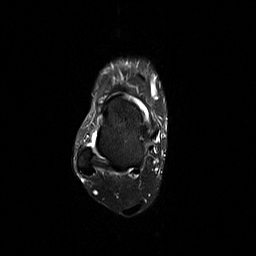
[im 27/40]
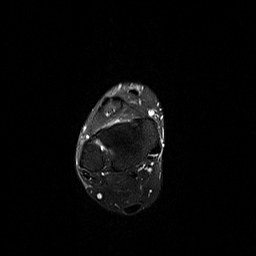
[im 35/40]
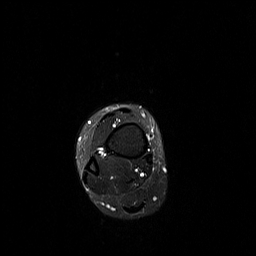
[im 40/40]
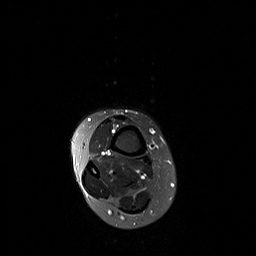

[Series 4: PD fat-sat · axial · 3.0mm · 0.44mm/px · z∈[-100,+40]mm · 9 of 40 slices shown]
[im 1/40]
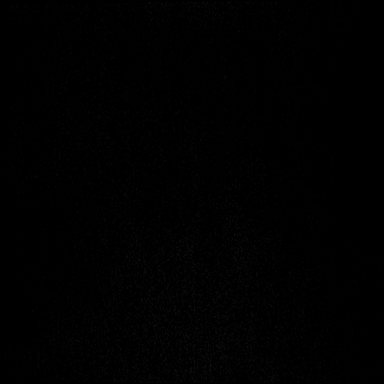
[im 5/40]
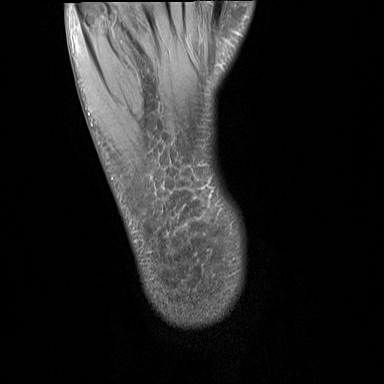
[im 10/40]
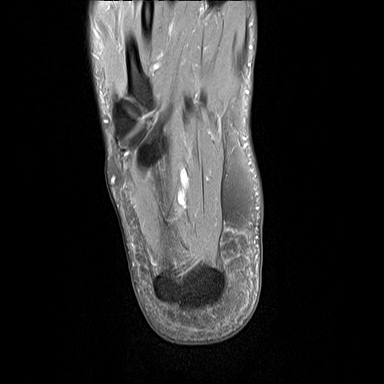
[im 15/40]
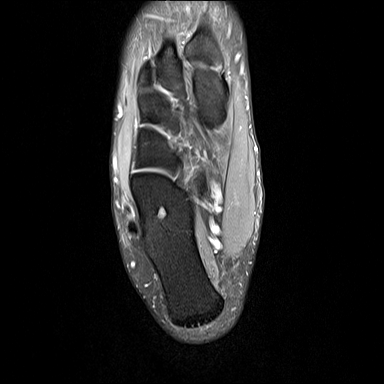
[im 20/40]
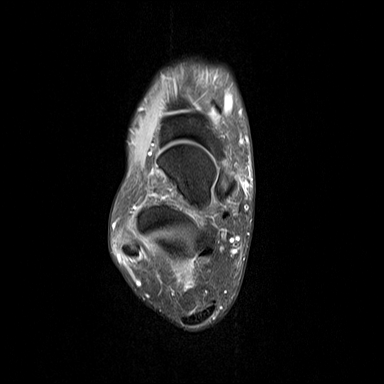
[im 25/40]
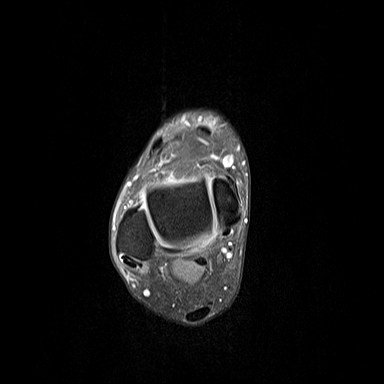
[im 30/40]
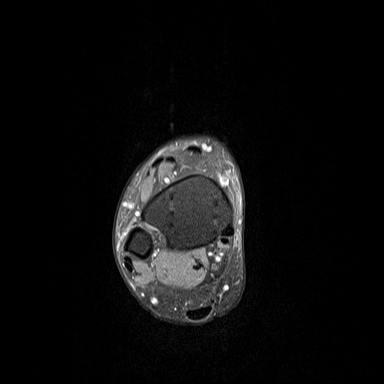
[im 35/40]
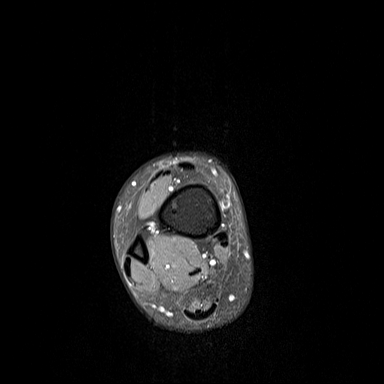
[im 40/40]
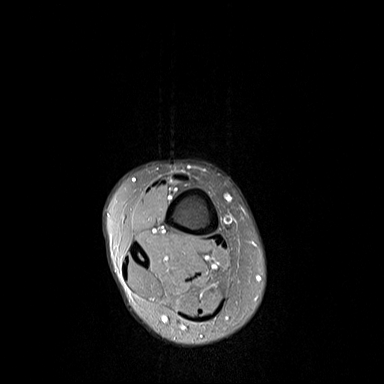

[Series 5: T1 · sagittal · 3.0mm · 0.31mm/px · 6 of 27 slices shown]
[im 1/27]
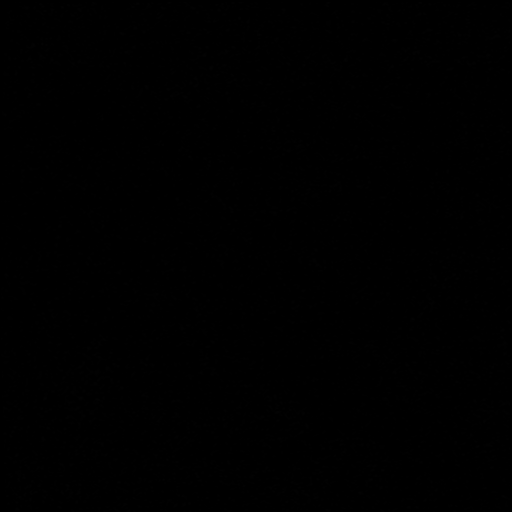
[im 6/27]
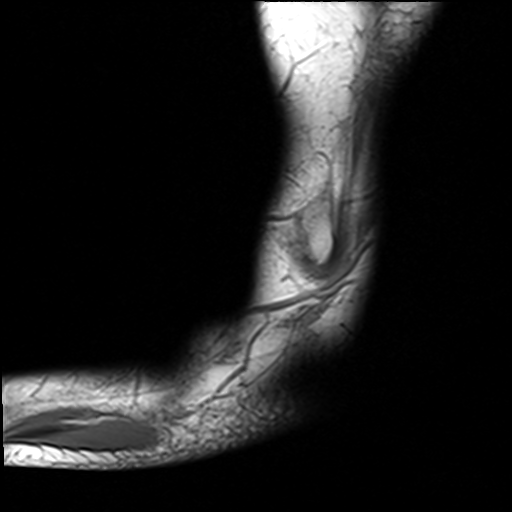
[im 11/27]
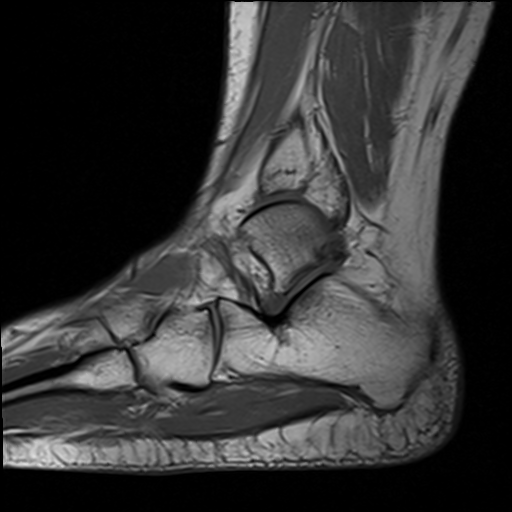
[im 16/27]
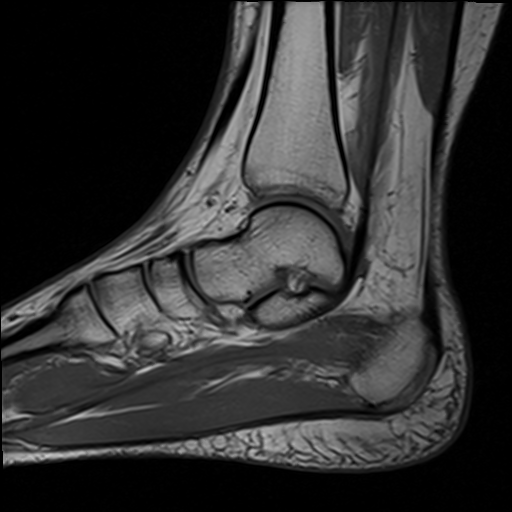
[im 21/27]
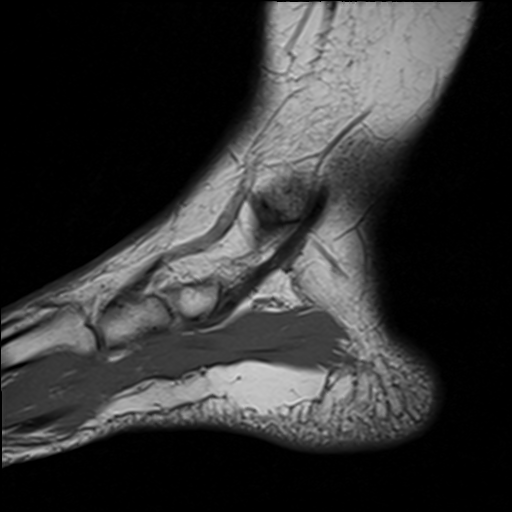
[im 27/27]
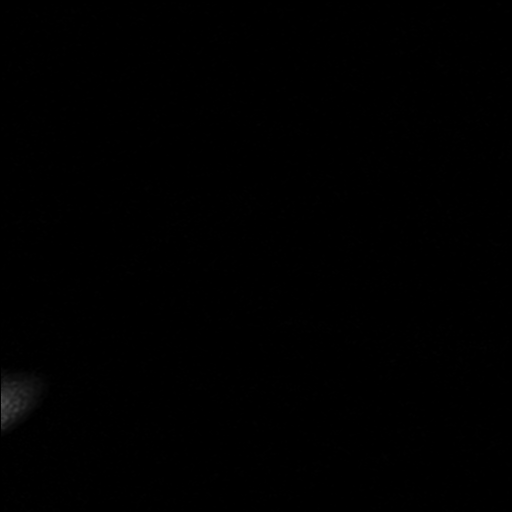

[Series 6: STIR · sagittal · 3.0mm · 0.62mm/px · 5 of 27 slices shown]
[im 1/27]
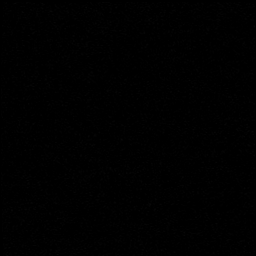
[im 6/27]
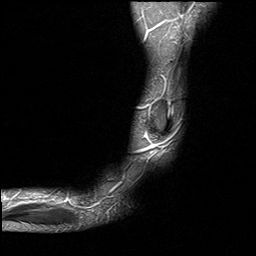
[im 11/27]
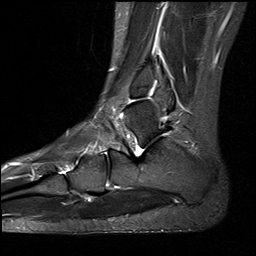
[im 16/27]
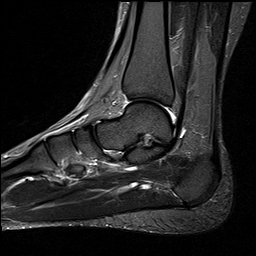
[im 21/27]
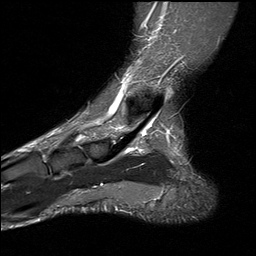

[Series 7: T2 fat-sat · coronal · 3.0mm · 0.50mm/px · 9 of 38 slices shown (2 of 2)]
[im 1/38]
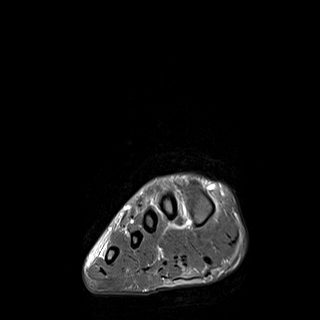
[im 5/38]
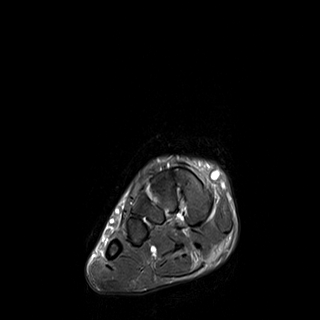
[im 10/38]
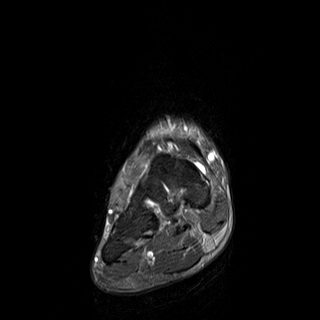
[im 14/38]
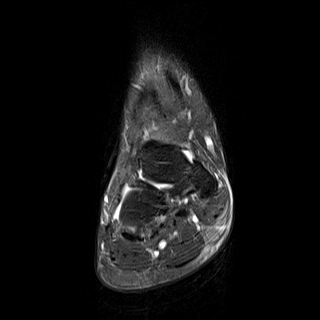
[im 19/38]
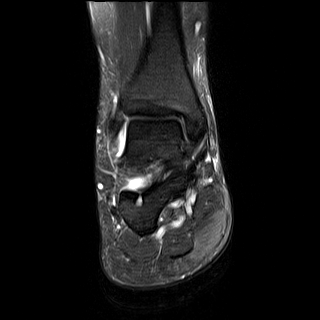
[im 24/38]
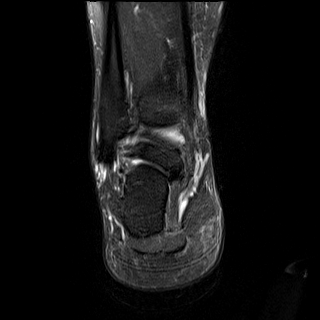
[im 28/38]
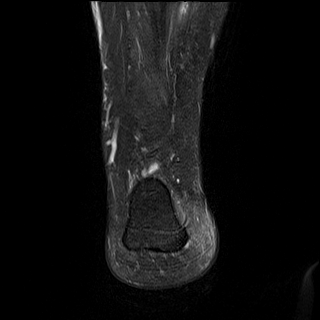
[im 33/38]
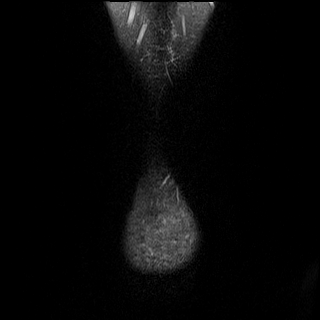
[im 38/38]
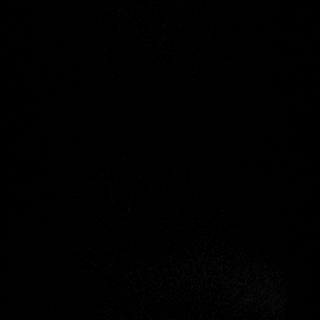

[37 of 40 positions shown; findings below may reference images not displayed]

FINDINGS: TENDONS

Peroneal: Moderate tenosynovitis involving the peroneal tendons. No
significant tendinopathy the or tendon tear.

Posteromedial: Intact. Mild tenosynovitis involving the posterior
tibialis tendon.

Anterior: Intact.  No tendinopathy or tenosynovitis.

Achilles: Normal

Plantar Fascia: Intact

LIGAMENTS

Lateral: The anterior talofibular ligament is torn. The
calcaneofibular ligament is injured but not completely torn. The
posterior talofibular ligament is intact.

The anterior and posterior tibiofibular ligaments are intact.

Medial: Intact

CARTILAGE

Ankle Joint: Small ankle joint effusion could be posttraumatic
synovitis. No cartilage defects or osteochondral lesion.

Subtalar Joints/Sinus Tarsi: Small subtalar joint effusions. The
sinus tarsi is unremarkable. The cervical and interosseous ligaments
are intact and the spring ligament is intact.

Bones: No acute bony findings. No fracture, bone contusion or
osteochondral abnormality.

Other: Unremarkable foot and ankle musculature.
IMPRESSION: 1. Complete tear of the anterior talofibular ligament. The
calcaneofibular ligament is injured but not completely torn.
2. Moderate tenosynovitis involving the peroneal tendons. No tendon
tear/rupture.
3. Mild tenosynovitis involving the posterior tibialis tendon.
4. Small ankle and subtalar joint effusions could be posttraumatic
synovitis. No cartilage defects or osteochondral lesions.
5. No acute bony findings.

## 2022-10-10 ENCOUNTER — Emergency Department (HOSPITAL_BASED_OUTPATIENT_CLINIC_OR_DEPARTMENT_OTHER)
Admission: EM | Admit: 2022-10-10 | Discharge: 2022-10-10 | Disposition: A | Discharge status: Home / Self Care | Payer: BC Managed Care – PPO | Attending: Emergency Medicine | Admitting: Emergency Medicine

## 2022-10-10 ENCOUNTER — Encounter (HOSPITAL_BASED_OUTPATIENT_CLINIC_OR_DEPARTMENT_OTHER): Payer: Self-pay

## 2022-10-10 ENCOUNTER — Other Ambulatory Visit: Payer: Self-pay

## 2022-10-10 DIAGNOSIS — G43909 Migraine, unspecified, not intractable, without status migrainosus: Secondary | ICD-10-CM | POA: Diagnosis not present

## 2022-10-10 DIAGNOSIS — Z91040 Latex allergy status: Secondary | ICD-10-CM | POA: Insufficient documentation

## 2022-10-10 DIAGNOSIS — R519 Headache, unspecified: Secondary | ICD-10-CM | POA: Diagnosis not present

## 2022-10-10 LAB — CBC WITH DIFFERENTIAL/PLATELET
Abs Immature Granulocytes: 0.02 10*3/uL (ref 0.00–0.07)
Basophils Absolute: 0 10*3/uL (ref 0.0–0.1)
Basophils Relative: 0 %
Eosinophils Absolute: 0.2 10*3/uL (ref 0.0–0.5)
Eosinophils Relative: 3 %
HCT: 36 % (ref 36.0–46.0)
Hemoglobin: 12.1 g/dL (ref 12.0–15.0)
Immature Granulocytes: 0 %
Lymphocytes Relative: 31 %
Lymphs Abs: 2.9 10*3/uL (ref 0.7–4.0)
MCH: 29.9 pg (ref 26.0–34.0)
MCHC: 33.6 g/dL (ref 30.0–36.0)
MCV: 88.9 fL (ref 80.0–100.0)
Monocytes Absolute: 0.4 10*3/uL (ref 0.1–1.0)
Monocytes Relative: 5 %
Neutro Abs: 5.7 10*3/uL (ref 1.7–7.7)
Neutrophils Relative %: 61 %
Platelets: 319 10*3/uL (ref 150–400)
RBC: 4.05 MIL/uL (ref 3.87–5.11)
RDW: 12.8 % (ref 11.5–15.5)
WBC: 9.3 10*3/uL (ref 4.0–10.5)
nRBC: 0 % (ref 0.0–0.2)

## 2022-10-10 LAB — BASIC METABOLIC PANEL
Anion gap: 8 (ref 5–15)
BUN: 9 mg/dL (ref 6–20)
CO2: 26 mmol/L (ref 22–32)
Calcium: 9 mg/dL (ref 8.9–10.3)
Chloride: 104 mmol/L (ref 98–111)
Creatinine, Ser: 0.61 mg/dL (ref 0.44–1.00)
GFR, Estimated: >60 mL/min (ref >60)
Glucose, Bld: 100 mg/dL — ABNORMAL HIGH (ref 70–99)
Potassium: 4.1 mmol/L (ref 3.5–5.1)
Sodium: 138 mmol/L (ref 135–145)

## 2022-10-10 LAB — PREGNANCY, URINE: Preg Test, Ur: NEGATIVE

## 2022-10-10 MED ORDER — ACETAMINOPHEN 500 MG PO TABS
1000.0000 mg | ORAL_TABLET | Freq: Once | ORAL | Status: AC
  Administered 2022-10-10: 1000 mg via ORAL
  Filled 2022-10-10: qty 2

## 2022-10-10 MED ORDER — SODIUM CHLORIDE 0.9 % IV SOLN: INTRAVENOUS | Status: DC

## 2022-10-10 MED ORDER — DIPHENHYDRAMINE HCL 50 MG/ML IJ SOLN
12.5000 mg | Freq: Once | INTRAMUSCULAR | Status: AC
  Administered 2022-10-10: 12.5 mg via INTRAVENOUS
  Filled 2022-10-10: qty 1

## 2022-10-10 MED ORDER — METOCLOPRAMIDE HCL 5 MG/ML IJ SOLN
10.0000 mg | Freq: Once | INTRAMUSCULAR | Status: AC
  Administered 2022-10-10: 10 mg via INTRAVENOUS
  Filled 2022-10-10: qty 2

## 2022-10-10 MED ORDER — SODIUM CHLORIDE 0.9 % IV BOLUS
1000.0000 mL | Freq: Once | INTRAVENOUS | Status: AC
  Administered 2022-10-10: 1000 mL via INTRAVENOUS

## 2022-10-10 NOTE — ED Triage Notes (Signed)
Patient arrives to ED POV C/O Migraine. Pt states migraine began at 5pm yesterday and she has been taking her prescribed medication for same with no relief. Denies any trauma to head or LOC. No other complaints at this time.

## 2022-10-10 NOTE — ED Provider Notes (Signed)
EMERGENCY DEPARTMENT AT [facility] Provider Note   CSN: [id number] Arrival date & time: 10/10/22  0040     History  Chief Complaint  Patient presents with   Migraine    Rebecca Parks is a 32 y.o. female.   Migraine     26 female with medical history significant for depression, migraine headaches who presents to the emergency department with a migraine.  The patient states that yesterday around 5 PM she had gradual onset of a headache typical of her migraine headaches.  She took sumatriptan at home but subsequently threw up the medication.  She endorsed nausea and vomiting, right sided throbbing headache, light sensitivity and sound sensitivity.  Not sudden onset or maximal in onset.  She denies any fevers or neck stiffness.  Home Medications Prior to Admission medications   Medication Sig Start Date End Date Taking? Authorizing Provider  cetirizine (ZYRTEC) 10 MG tablet Take 10 mg by mouth daily.    [provider]  citalopram (CELEXA) 20 MG tablet Take 40 mg by mouth daily.    [provider]  loratadine (CLARITIN) 10 MG tablet Take 10 mg by mouth daily.    [provider]  omeprazole (PRILOSEC) 20 MG capsule Take 20 mg by mouth daily.    [provider]  SUMAtriptan (IMITREX) 50 MG tablet Take 50 mg at the onset of headache; may repeat every 2 hours but no more than 200 mg/24 hours. 11/04/21   Georgina Quint, MD      Allergies    Tramadol and Latex    Review of Systems   Review of Systems  All other systems reviewed and are negative.   Physical Exam Updated Vital Signs BP 114/70   Pulse 63   Temp 98 F (36.7 C) (Oral)   Resp 20   Ht 5\' 2"  (1.575 m)   Wt 99.8 kg   SpO2 99%   BMI 40.24 kg/m  Physical Exam Vitals and nursing note reviewed.  Constitutional:      General: She is not in acute distress.    Appearance: She is well-developed.  HENT:     Head: Normocephalic and atraumatic.      Comments: No meningismus Eyes:     Conjunctiva/sclera: Conjunctivae normal.  Cardiovascular:     Rate and Rhythm: Normal rate and regular rhythm.  Pulmonary:     Effort: Pulmonary effort is normal. No respiratory distress.     Breath sounds: Normal breath sounds.  Abdominal:     Palpations: Abdomen is soft.     Tenderness: There is no abdominal tenderness.  Musculoskeletal:        General: No swelling.     Cervical back: Neck supple.  Skin:    General: Skin is warm and dry.     Capillary Refill: Capillary refill takes less than 2 seconds.  Neurological:     Mental Status: She is alert.     Comments: MENTAL STATUS EXAM:    Orientation: Alert and oriented to person, place and time.  Memory: Cooperative, follows commands well.  Language: Speech is clear and language is normal.   CRANIAL NERVES:    CN 2 (Optic): Visual fields intact to confrontation.  CN 3,4,6 (EOM): Pupils equal and reactive to light. Full extraocular eye movement without nystagmus.  CN 5 (Trigeminal): Facial sensation is normal, no weakness of masticatory muscles.  CN 7 (Facial): No facial weakness or asymmetry.  CN 8 (Auditory): Auditory acuity grossly normal.  CN 9,10 (Glossophar): The uvula is midline, the palate elevates symmetrically.  CN 11 (spinal access): Normal sternocleidomastoid and trapezius strength.  CN 12 (Hypoglossal): The tongue is midline. No atrophy or fasciculations.Marland Kitchen   MOTOR:  Muscle Strength: 5/5RUE, 5/5LUE, 5/5RLE, 5/5LLE.   COORDINATION:   No tremor.   SENSATION:   Intact to light touch all four extremities.   Psychiatric:        Mood and Affect: Mood normal.     ED Results / Procedures / Treatments   Labs (all labs ordered are listed, but only abnormal results are displayed) Labs Reviewed  BASIC METABOLIC PANEL - Abnormal; Notable for the following components:      Result Value   Glucose, Bld 100 (*)    All other components within normal limits  PREGNANCY, URINE  CBC WITH  DIFFERENTIAL/PLATELET    EKG None  Radiology No results found.  Procedures Procedures    Medications Ordered in ED Medications  sodium chloride 0.9 % bolus 1,000 mL (1,000 mLs Intravenous New Bag/Given 10/10/22 0353)    And  0.9 %  sodium chloride infusion (has no administration in time range)  metoCLOPramide (REGLAN) injection 10 mg (10 mg Intravenous Given 10/10/22 0352)  diphenhydrAMINE (BENADRYL) injection 12.5 mg (12.5 mg Intravenous Given 10/10/22 0351)  acetaminophen (TYLENOL) tablet 1,000 mg (1,000 mg Oral Given 10/10/22 0351)    ED Course/ Medical Decision Making/ A&P                                 Medical Decision Making Amount and/or Complexity of Data Reviewed Labs: ordered.  Risk OTC drugs. Prescription drug management.    11 female with medical history significant for depression, migraine headaches who presents to the emergency department with a migraine.  The patient states that yesterday around 5 PM she had gradual onset of a headache typical of her migraine headaches.  She took sumatriptan at home but subsequently threw up the medication.  She endorsed nausea and vomiting, right sided throbbing headache, light sensitivity and sound sensitivity.  Not sudden onset or maximal in onset.  She denies any fevers or neck stiffness.  Rebecca Parks is a 32 y.o. female who presents with headache as per above. I have reviewed the nursing documentation for past medical history, family history, and social history. I have reviewed the EMR and have learned that she has a hx of migraines.  On arrival, the patient was vitally stable, sinus rhythm noted on cardiac telemetry.  Currently is awake,  alert, GCS 15, HDS, and afebrile. Her exam is most notable for normal gait, fully intact extraocular motions with bilaterally reactive pupils, no focal neurologic deficits, no meningismus, and no temporal tenderness. There is no rash. The headache was not sudden onset or the worst  headache of the patient's life. There is no visual deficit.  I am most concerned for migraine headache.  To further evaluate and risk stratify her, labs were obtained, which were significant for:  Labs: CBC, BMP and urine pregnancy unremarkable   I do not think the patient has an aneurysm, intracranial bleed, mass lesion, meningitis, temporal arteritis, stroke, cluster headache, idiopathic intracranial hypertension, cavernous sinus thrombosis, carbon monoxide toxicity, herpes zoster, carotid or vertebral artery dissection, or acute angle close glaucoma.  On reassessment, the patient remained well appearing and was again able to ambulate without difficulty and did not have any focal neurologic deficits.  I believe the  patient is stable for discharge.  We participated in shared decision making regarding continued observation in the ED versus discharge home for continued recovery after receiving the below medications. She preferred to recover at home. I believe that this is safe and reasonable.  We have discussed the diagnosis and risks, and we agree with discharging home to follow-up with their primary doctor. We also discussed returning to the Emergency Department immediately if new or worsening symptoms occur. We have discussed the symptoms which are most concerning (e.g., changing or worsening pain, weakness, vomiting, fever, or abnormal sensation) that necessitate immediate return. I provided ED return precautions. The patient felt safe with this plan.  ED Medication Summary: Medications  sodium chloride 0.9 % bolus 1,000 mL (1,000 mLs Intravenous New Bag/Given 10/10/22 0353)    And  0.9 %  sodium chloride infusion (has no administration in time range)  metoCLOPramide (REGLAN) injection 10 mg (10 mg Intravenous Given 10/10/22 0352)  diphenhydrAMINE (BENADRYL) injection 12.5 mg (12.5 mg Intravenous Given 10/10/22 0351)  acetaminophen (TYLENOL) tablet 1,000 mg (1,000 mg Oral Given 10/10/22 0351)       Final Clinical Impression(s) / ED Diagnoses Final diagnoses:  Migraine without status migrainosus, not intractable, unspecified migraine type    Rx / DC Orders ED Discharge Orders     None         Ernie Avena, MD 10/10/22 [phone]

## 2022-10-10 NOTE — Discharge Instructions (Addendum)
Your symptoms improved with a migraine cocktail and were consistent with your prior migraines. Follow-up with your PCP.

## 2022-12-26 DIAGNOSIS — Z01419 Encounter for gynecological examination (general) (routine) without abnormal findings: Secondary | ICD-10-CM | POA: Diagnosis not present

## 2022-12-26 DIAGNOSIS — Z124 Encounter for screening for malignant neoplasm of cervix: Secondary | ICD-10-CM | POA: Diagnosis not present
